# Patient Record
Sex: Male | Born: 1945 | Race: White | Hispanic: No | Smoking: Former smoker
Health system: Southern US, Community
[De-identification: ages and names within clinical notes are randomized; demographics above are authoritative.]

## PROBLEM LIST (undated history)

## (undated) DIAGNOSIS — J449 Chronic obstructive pulmonary disease, unspecified: Secondary | ICD-10-CM

## (undated) DIAGNOSIS — I10 Essential (primary) hypertension: Secondary | ICD-10-CM

---

## 2014-05-23 DIAGNOSIS — I5030 Unspecified diastolic (congestive) heart failure: Secondary | ICD-10-CM | POA: Insufficient documentation

## 2015-06-28 DIAGNOSIS — Z5320 Procedure and treatment not carried out because of patient's decision for unspecified reasons: Secondary | ICD-10-CM | POA: Insufficient documentation

## 2015-07-11 DIAGNOSIS — G4733 Obstructive sleep apnea (adult) (pediatric): Secondary | ICD-10-CM | POA: Insufficient documentation

## 2016-03-28 DIAGNOSIS — J449 Chronic obstructive pulmonary disease, unspecified: Secondary | ICD-10-CM | POA: Insufficient documentation

## 2016-03-28 DIAGNOSIS — I272 Pulmonary hypertension, unspecified: Secondary | ICD-10-CM | POA: Insufficient documentation

## 2017-09-23 DIAGNOSIS — Z86010 Personal history of colonic polyps: Secondary | ICD-10-CM | POA: Insufficient documentation

## 2018-01-22 DIAGNOSIS — R7303 Prediabetes: Secondary | ICD-10-CM | POA: Insufficient documentation

## 2018-07-23 DIAGNOSIS — G609 Hereditary and idiopathic neuropathy, unspecified: Secondary | ICD-10-CM | POA: Insufficient documentation

## 2019-01-01 DIAGNOSIS — Z87891 Personal history of nicotine dependence: Secondary | ICD-10-CM | POA: Insufficient documentation

## 2020-03-15 ENCOUNTER — Inpatient Hospital Stay (HOSPITAL_COMMUNITY): Payer: Medicare Other

## 2020-03-15 ENCOUNTER — Emergency Department (HOSPITAL_COMMUNITY): Payer: Medicare Other

## 2020-03-15 ENCOUNTER — Encounter (HOSPITAL_COMMUNITY): Payer: Self-pay | Admitting: Emergency Medicine

## 2020-03-15 ENCOUNTER — Other Ambulatory Visit: Payer: Self-pay

## 2020-03-15 ENCOUNTER — Inpatient Hospital Stay (HOSPITAL_COMMUNITY)
Admission: EM | Admit: 2020-03-15 | Discharge: 2020-03-21 | DRG: 522 | Disposition: A | Discharge status: Skilled Nursing Facility | Payer: Medicare Other | Attending: Internal Medicine | Admitting: Internal Medicine

## 2020-03-15 DIAGNOSIS — Z7901 Long term (current) use of anticoagulants: Secondary | ICD-10-CM | POA: Diagnosis not present

## 2020-03-15 DIAGNOSIS — Y92019 Unspecified place in single-family (private) house as the place of occurrence of the external cause: Secondary | ICD-10-CM | POA: Diagnosis not present

## 2020-03-15 DIAGNOSIS — Z419 Encounter for procedure for purposes other than remedying health state, unspecified: Secondary | ICD-10-CM

## 2020-03-15 DIAGNOSIS — J449 Chronic obstructive pulmonary disease, unspecified: Secondary | ICD-10-CM | POA: Diagnosis present

## 2020-03-15 DIAGNOSIS — G4733 Obstructive sleep apnea (adult) (pediatric): Secondary | ICD-10-CM | POA: Diagnosis present

## 2020-03-15 DIAGNOSIS — I10 Essential (primary) hypertension: Secondary | ICD-10-CM | POA: Diagnosis present

## 2020-03-15 DIAGNOSIS — S72001A Fracture of unspecified part of neck of right femur, initial encounter for closed fracture: Secondary | ICD-10-CM | POA: Diagnosis present

## 2020-03-15 DIAGNOSIS — Z20822 Contact with and (suspected) exposure to covid-19: Secondary | ICD-10-CM | POA: Diagnosis present

## 2020-03-15 DIAGNOSIS — Y92009 Unspecified place in unspecified non-institutional (private) residence as the place of occurrence of the external cause: Secondary | ICD-10-CM

## 2020-03-15 DIAGNOSIS — Z87891 Personal history of nicotine dependence: Secondary | ICD-10-CM

## 2020-03-15 DIAGNOSIS — R944 Abnormal results of kidney function studies: Secondary | ICD-10-CM | POA: Diagnosis not present

## 2020-03-15 DIAGNOSIS — W1839XA Other fall on same level, initial encounter: Secondary | ICD-10-CM | POA: Diagnosis present

## 2020-03-15 DIAGNOSIS — I959 Hypotension, unspecified: Secondary | ICD-10-CM | POA: Diagnosis not present

## 2020-03-15 DIAGNOSIS — S72011A Unspecified intracapsular fracture of right femur, initial encounter for closed fracture: Principal | ICD-10-CM | POA: Diagnosis present

## 2020-03-15 DIAGNOSIS — I4821 Permanent atrial fibrillation: Secondary | ICD-10-CM | POA: Diagnosis present

## 2020-03-15 DIAGNOSIS — E860 Dehydration: Secondary | ICD-10-CM | POA: Diagnosis not present

## 2020-03-15 DIAGNOSIS — Z79899 Other long term (current) drug therapy: Secondary | ICD-10-CM

## 2020-03-15 DIAGNOSIS — Z96641 Presence of right artificial hip joint: Secondary | ICD-10-CM

## 2020-03-15 DIAGNOSIS — W19XXXA Unspecified fall, initial encounter: Secondary | ICD-10-CM

## 2020-03-15 HISTORY — DX: Essential (primary) hypertension: I10

## 2020-03-15 HISTORY — DX: Chronic obstructive pulmonary disease, unspecified: J44.9

## 2020-03-15 LAB — COMPREHENSIVE METABOLIC PANEL
ALT: 28 U/L (ref 0–44)
AST: 33 U/L (ref 15–41)
Albumin: 4 g/dL (ref 3.5–5.0)
Alkaline Phosphatase: 78 U/L (ref 38–126)
Anion gap: 10 (ref 5–15)
BUN: 22 mg/dL (ref 8–23)
CO2: 22 mmol/L (ref 22–32)
Calcium: 9.1 mg/dL (ref 8.9–10.3)
Chloride: 106 mmol/L (ref 98–111)
Creatinine, Ser: 0.86 mg/dL (ref 0.61–1.24)
GFR calc Af Amer: >60 mL/min (ref >60)
GFR calc non Af Amer: >60 mL/min (ref >60)
Glucose, Bld: 126 mg/dL — ABNORMAL HIGH (ref 70–99)
Potassium: 5 mmol/L (ref 3.5–5.1)
Sodium: 138 mmol/L (ref 135–145)
Total Bilirubin: 1.2 mg/dL (ref 0.3–1.2)
Total Protein: 6.6 g/dL (ref 6.5–8.1)

## 2020-03-15 LAB — CBC WITH DIFFERENTIAL/PLATELET
Abs Immature Granulocytes: 0.05 10*3/uL (ref 0.00–0.07)
Basophils Absolute: 0 10*3/uL (ref 0.0–0.1)
Basophils Relative: 0 %
Eosinophils Absolute: 0.1 10*3/uL (ref 0.0–0.5)
Eosinophils Relative: 1 %
HCT: 49.2 % (ref 39.0–52.0)
Hemoglobin: 16.2 g/dL (ref 13.0–17.0)
Immature Granulocytes: 1 %
Lymphocytes Relative: 8 %
Lymphs Abs: 0.7 10*3/uL (ref 0.7–4.0)
MCH: 29.8 pg (ref 26.0–34.0)
MCHC: 32.9 g/dL (ref 30.0–36.0)
MCV: 90.6 fL (ref 80.0–100.0)
Monocytes Absolute: 0.6 10*3/uL (ref 0.1–1.0)
Monocytes Relative: 7 %
Neutro Abs: 7.7 10*3/uL (ref 1.7–7.7)
Neutrophils Relative %: 83 %
Platelets: 191 10*3/uL (ref 150–400)
RBC: 5.43 MIL/uL (ref 4.22–5.81)
RDW: 15.3 % (ref 11.5–15.5)
WBC: 9.2 10*3/uL (ref 4.0–10.5)
nRBC: 0 % (ref 0.0–0.2)

## 2020-03-15 LAB — SARS CORONAVIRUS 2 BY RT PCR (HOSPITAL ORDER, PERFORMED IN ~~LOC~~ HOSPITAL LAB): SARS Coronavirus 2: NEGATIVE

## 2020-03-15 LAB — MAGNESIUM: Magnesium: 2.2 mg/dL (ref 1.7–2.4)

## 2020-03-15 MED ORDER — POVIDONE-IODINE 10 % EX SWAB: 2.0000 "application " | Freq: Once | CUTANEOUS | Status: AC

## 2020-03-15 MED ORDER — AMLODIPINE BESYLATE 5 MG PO TABS
2.5000 mg | ORAL_TABLET | Freq: Every day | ORAL | Status: DC
  Administered 2020-03-15 – 2020-03-17 (×3): 2.5 mg via ORAL
  Filled 2020-03-15 (×3): qty 1

## 2020-03-15 MED ORDER — CHLORHEXIDINE GLUCONATE 4 % EX LIQD
60.0000 mL | Freq: Once | CUTANEOUS | Status: DC
  Filled 2020-03-15: qty 60

## 2020-03-15 MED ORDER — CEFAZOLIN SODIUM-DEXTROSE 2-4 GM/100ML-% IV SOLN
2.0000 g | INTRAVENOUS | Status: AC
  Administered 2020-03-16: 2 g via INTRAVENOUS
  Filled 2020-03-15: qty 100

## 2020-03-15 MED ORDER — HYDROMORPHONE HCL 1 MG/ML IJ SOLN
0.5000 mg | INTRAMUSCULAR | Status: DC | PRN
  Administered 2020-03-15: 1 mg via INTRAVENOUS
  Filled 2020-03-15: qty 1

## 2020-03-15 MED ORDER — IRBESARTAN 150 MG PO TABS
150.0000 mg | ORAL_TABLET | Freq: Every day | ORAL | Status: DC
  Administered 2020-03-15 – 2020-03-17 (×3): 150 mg via ORAL
  Filled 2020-03-15 (×4): qty 1

## 2020-03-15 MED ORDER — MOMETASONE FURO-FORMOTEROL FUM 200-5 MCG/ACT IN AERO: 2.0000 | INHALATION_SPRAY | Freq: Two times a day (BID) | RESPIRATORY_TRACT | Status: DC

## 2020-03-15 MED ORDER — ONDANSETRON HCL 4 MG/2ML IJ SOLN: 4.0000 mg | Freq: Four times a day (QID) | INTRAMUSCULAR | Status: DC | PRN

## 2020-03-15 MED ORDER — HYDROCODONE-ACETAMINOPHEN 5-325 MG PO TABS
1.0000 | ORAL_TABLET | Freq: Four times a day (QID) | ORAL | Status: DC | PRN
  Administered 2020-03-15 – 2020-03-21 (×3): 1 via ORAL
  Filled 2020-03-15 (×3): qty 1

## 2020-03-15 MED ORDER — TRANEXAMIC ACID-NACL 1000-0.7 MG/100ML-% IV SOLN
1000.0000 mg | INTRAVENOUS | Status: AC
  Administered 2020-03-16: 1000 mg via INTRAVENOUS
  Filled 2020-03-15 (×2): qty 100

## 2020-03-15 MED ORDER — ACETAMINOPHEN 650 MG RE SUPP: 650.0000 mg | Freq: Four times a day (QID) | RECTAL | Status: DC | PRN

## 2020-03-15 MED ORDER — SPIRONOLACTONE 12.5 MG HALF TABLET: 12.5000 mg | ORAL_TABLET | Freq: Every day | ORAL | Status: DC

## 2020-03-15 MED ORDER — ENOXAPARIN SODIUM 40 MG/0.4ML ~~LOC~~ SOLN: 40.0000 mg | SUBCUTANEOUS | Status: DC

## 2020-03-15 MED ORDER — LACTATED RINGERS IV SOLN: INTRAVENOUS | Status: DC

## 2020-03-15 MED ORDER — HYDRALAZINE HCL 20 MG/ML IJ SOLN: 10.0000 mg | Freq: Four times a day (QID) | INTRAMUSCULAR | Status: DC | PRN

## 2020-03-15 MED ORDER — ACETAMINOPHEN 325 MG PO TABS
650.0000 mg | ORAL_TABLET | Freq: Four times a day (QID) | ORAL | Status: DC | PRN
  Administered 2020-03-18 – 2020-03-19 (×4): 650 mg via ORAL
  Filled 2020-03-15 (×4): qty 2

## 2020-03-15 MED ORDER — ROSUVASTATIN CALCIUM 10 MG PO TABS
10.0000 mg | ORAL_TABLET | Freq: Every day | ORAL | Status: DC
  Administered 2020-03-15 – 2020-03-20 (×6): 10 mg via ORAL
  Filled 2020-03-15 (×6): qty 1

## 2020-03-15 MED ORDER — FUROSEMIDE 40 MG PO TABS
40.0000 mg | ORAL_TABLET | Freq: Every day | ORAL | Status: DC
  Administered 2020-03-15 – 2020-03-21 (×7): 40 mg via ORAL
  Filled 2020-03-15 (×7): qty 1

## 2020-03-15 MED ORDER — ESCITALOPRAM OXALATE 10 MG PO TABS
10.0000 mg | ORAL_TABLET | Freq: Every day | ORAL | Status: DC
  Administered 2020-03-15 – 2020-03-21 (×7): 10 mg via ORAL
  Filled 2020-03-15 (×7): qty 1

## 2020-03-15 MED ORDER — AMLODIPINE BESYLATE-VALSARTAN 5-320 MG PO TABS: 0.5000 | ORAL_TABLET | Freq: Every day | ORAL | Status: DC

## 2020-03-15 MED ORDER — HYDROMORPHONE HCL 1 MG/ML IJ SOLN
0.5000 mg | Freq: Once | INTRAMUSCULAR | Status: AC
  Administered 2020-03-15: 0.5 mg via INTRAVENOUS
  Filled 2020-03-15: qty 1

## 2020-03-15 MED ORDER — ATENOLOL 25 MG PO TABS
25.0000 mg | ORAL_TABLET | Freq: Every day | ORAL | Status: DC
  Administered 2020-03-15 – 2020-03-17 (×3): 25 mg via ORAL
  Filled 2020-03-15 (×4): qty 1

## 2020-03-15 MED ORDER — ONDANSETRON HCL 4 MG PO TABS: 4.0000 mg | ORAL_TABLET | Freq: Four times a day (QID) | ORAL | Status: DC | PRN

## 2020-03-15 MED ORDER — MOMETASONE FURO-FORMOTEROL FUM 200-5 MCG/ACT IN AERO
2.0000 | INHALATION_SPRAY | Freq: Two times a day (BID) | RESPIRATORY_TRACT | Status: DC
  Administered 2020-03-15 – 2020-03-21 (×12): 2 via RESPIRATORY_TRACT
  Filled 2020-03-15: qty 8.8

## 2020-03-15 NOTE — ED Triage Notes (Addendum)
Patient BIBA from home, reports falling on R hip after standing up from chair. E MS states hip appears to be dislocated and shortened. No LOC. Patient AOx4. Hx sleep apnea and COPD. Patient on blood thinners   BP130/59 P 88 RR 17 T 97.9   50 mcg Fentanyl  20 L AC

## 2020-03-15 NOTE — Anesthesia Preprocedure Evaluation (Addendum)
Anesthesia Evaluation  Patient identified by MRN, date of birth, ID band Patient awake    Reviewed: Allergy & Precautions, NPO status , Patient's Chart, lab work & pertinent test results  Airway Mallampati: II  TM Distance: >3 FB Neck ROM: Full    Dental no notable dental hx. (+) Teeth Intact, Dental Advisory Given   Pulmonary COPD,  COPD inhaler, former smoker,    Pulmonary exam normal breath sounds clear to auscultation       Cardiovascular hypertension, Pt. on medications and Pt. on home beta blockers Normal cardiovascular exam+ dysrhythmias Atrial Fibrillation  Rhythm:Regular Rate:Normal     Neuro/Psych negative neurological ROS  negative psych ROS   GI/Hepatic negative GI ROS, Neg liver ROS,   Endo/Other  negative endocrine ROS  Renal/GU negative Renal ROSK+ 5.0 Cr 0.86     Musculoskeletal negative musculoskeletal ROS (+)   Abdominal   Peds  Hematology negative hematology ROS (+) Hgb 16.2   Anesthesia Other Findings   Reproductive/Obstetrics                            Anesthesia Physical Anesthesia Plan  ASA: III  Anesthesia Plan: General   Post-op Pain Management:    Induction: Intravenous  PONV Risk Score and Plan: 3 and Treatment may vary due to age or medical condition, Ondansetron and Dexamethasone  Airway Management Planned: Oral ETT  Additional Equipment: None  Intra-op Plan:   Post-operative Plan: Extubation in OR  Informed Consent: I have reviewed the patients History and Physical, chart, labs and discussed the procedure including the risks, benefits and alternatives for the proposed anesthesia with the patient or authorized representative who has indicated his/her understanding and acceptance.     Dental advisory given  Plan Discussed with: CRNA and Anesthesiologist  Anesthesia Plan Comments: (Pt on Eliquis   GA)       Anesthesia Quick  Evaluation

## 2020-03-15 NOTE — Consult Note (Signed)
ORTHOPAEDIC CONSULTATION  REQUESTING PHYSICIAN: Darliss Cheney, MD  PCP:  Endocrinology, [facility]  Chief Complaint: Right hip pain  HPI: Ronald Shepherd is a 74 y.o. male who presented to [place] ED on 03/15/20 after sustaining a fall this morning. He reports he got out of his recliner after waking up this morning, and almost immediately tripped and fell onto his right hip. He was unable to stand after several attempts, but was close enough to press his life alert button. EMS arrived and transported him to the ED. He denies head injury or LOC. In the ED, imaging revealed right hip comminuted subcapital femoral neck fracture. Dr. Wynelle Link was consulted for orthopaedic management.  He is on Eliquis for atrial fibrillation, and his last dose was 03/14/20 at 9 pm. He reports that he lives alone, but does have family near by. At baseline, he ambulates without assistive devices.   Past Medical History:  Diagnosis Date  . COPD (chronic obstructive pulmonary disease) ([place])   . Hypertension    History reviewed. No pertinent surgical history. Social History   Socioeconomic History  . Marital status: Widowed    Spouse name: Not on file  . Number of children: Not on file  . Years of education: Not on file  . Highest education level: Not on file  Occupational History  . Not on file  Tobacco Use  . Smoking status: Former Smoker    Packs/day: 1.00    Years: 20.00    Pack years: 20.00    Types: Cigarettes    Quit date: 02/14/2015    Years since quitting: 5.0  . Smokeless tobacco: Never Used  Substance and Sexual Activity  . Alcohol use: Not Currently  . Drug use: Never  . Sexual activity: Not on file  Other Topics Concern  . Not on file  Social History Narrative  . Not on file   Social Determinants of Health   Financial Resource Strain:   . Difficulty of Paying Living Expenses:   Food Insecurity:   . Worried About Charity fundraiser in the Last Year:   . Arboriculturist in  the Last Year:   Transportation Needs:   . Film/video editor (Medical):   Marland Kitchen Lack of Transportation (Non-Medical):   Physical Activity:   . Days of Exercise per Week:   . Minutes of Exercise per Session:   Stress:   . Feeling of Stress :   Social Connections:   . Frequency of Communication with Friends and Family:   . Frequency of Social Gatherings with Friends and Family:   . Attends Religious Services:   . Active Member of Clubs or Organizations:   . Attends Archivist Meetings:   Marland Kitchen Marital Status:    History reviewed. No pertinent family history. No Known Allergies Prior to Admission medications   Medication Sig Start Date End Date Taking? Authorizing Provider  albuterol (VENTOLIN HFA) 108 (90 Base) MCG/ACT inhaler Inhale 2 puffs into the lungs every 4 (four) hours as needed for shortness of breath. 05/02/17  Yes [provider]  amLODipine-valsartan (EXFORGE) 5-320 MG tablet Take 0.5 tablets by mouth daily. 01/12/20  Yes [provider]  Ascorbic Acid (VITAMIN C PO) Take 1 tablet by mouth daily.   Yes [provider]  atenolol (TENORMIN) 25 MG tablet Take 25 mg by mouth daily. 01/12/20  Yes [provider]  b complex vitamins capsule Take 1 capsule by mouth daily.   Yes [provider]  Cholecalciferol 25 MCG (1000 UT) capsule Take 1,000 Units by mouth daily. 05/02/15  Yes [provider]  ELIQUIS 5 MG TABS tablet Take 5 mg by mouth 2 (two) times daily. 01/12/20  Yes [provider]  escitalopram (LEXAPRO) 10 MG tablet Take 10 mg by mouth daily. 02/28/20  Yes [provider]  Fluticasone-Salmeterol (ADVAIR) 250-50 MCG/DOSE AEPB Inhale 1 puff into the lungs 2 (two) times daily. 02/29/20  Yes [provider]  furosemide (LASIX) 40 MG tablet Take 40 mg by mouth daily. 02/25/20  Yes [provider]  Multiple Vitamins-Minerals (CENTRUM PO) Take 1 tablet by mouth daily.   Yes [provider]  potassium chloride SA (KLOR-CON) 20 MEQ tablet Take 20 mEq by mouth 2 (two) times daily. 03/02/20  Yes [provider]  rosuvastatin (CRESTOR) 10 MG tablet Take 10 mg by mouth at bedtime. 01/25/20  Yes [provider]  spironolactone (ALDACTONE) 25 MG tablet Take 12.5 mg by mouth daily. 02/25/20  Yes [provider]   DG Chest Port 1 View  Result Date: 03/15/2020 CLINICAL DATA:  Recent fall with hip fracture, initial encounter EXAM: PORTABLE CHEST 1 VIEW COMPARISON:  04/28/2015 FINDINGS: Cardiac shadow is enlarged but stable. Aortic calcifications are again seen. The lungs are hyperinflated consistent with COPD but stable. No infiltrate is seen. No bony abnormality is noted. IMPRESSION: No acute abnormality noted. Electronically Signed   By: Alcide Clever M.D.   On: 03/15/2020 10:33   DG Hip Unilat W or Wo Pelvis 2-3 Views Right  Result Date: 03/15/2020 CLINICAL DATA:  Pain following fall EXAM: DG HIP (WITH OR WITHOUT PELVIS) 2-3V RIGHT COMPARISON:  None. FINDINGS: Frontal pelvis as well as frontal and lateral right hip images were obtained. There is a comminuted fracture of the subcapital femoral neck region with varus angulation at the fracture site. There is no other fracture appreciable. No dislocation. There is moderate symmetric narrowing of each hip joint. No erosive change. IMPRESSION: Comminuted subcapital femoral neck fracture on the right with varus angulation at the fracture site. No other fracture. No dislocation. Symmetric narrowing each hip joint. Electronically Signed   By: Bretta Bang III M.D.   On: 03/15/2020 09:52    Positive ROS: All other systems have been reviewed and were otherwise negative with the exception of those mentioned in the HPI and as above.  Physical Exam: General: Alert, no acute distress Cardiovascular: No pedal edema Respiratory: No cyanosis, no use of accessory musculature GI: No organomegaly, abdomen is soft and  non-tender Skin: No lesions in the area of chief complaint Neurologic: Sensation intact distally Psychiatric: Patient is competent for consent with normal mood and affect Lymphatic: No axillary or cervical lymphadenopathy  MUSCULOSKELETAL:   Right Lower Extremity: Skin intact about the right hip. No evidence of abrasions or ecchymosis. Distal pulses 2+, sensation intact.   Assessment: Right femoral neck fracture   Plan: Radiographic findings discussed with Ronald Shepherd today. We will plan for surgical management with right total hip arthroplasty. He is on Eliquis, with last dose 03/15/20 at 9 pm. We discussed timing of surgery today as it relates to risk of bleeding, and we agree to hold off on surgery until tomorrow. Dr. Lequita Halt, Dr. Charlann Boxer, or Dr. Linna Caprice will perform the surgery tomorrow depending on availability. He will be NPO after midnight tonight. Continue pain management.   Tommie Ard, PA-C Cell [phone]   03/15/2020 1:12 PM

## 2020-03-15 NOTE — ED Provider Notes (Signed)
[facility]-EMERGENCY DEPT Provider Note   CSN: [id number] Arrival date & time: 03/15/20  0855     History Chief Complaint  Patient presents with  . Fall    Ronald Shepherd is a 74 y.o. male.  Patient states that he slipped and fell today landing on his right hip.  Patient did not hit his head and does not complain of any other areas of discomfort.  Patient has history of COPD hypertension atrial fib.  He is on Eliquis but the last time he took it was 9 PM yesterday  The history is provided by the patient. No language interpreter was used.  Fall This is a new problem. The current episode started 3 to 5 hours ago. The problem occurs rarely. The problem has been resolved. Pertinent negatives include no chest pain, no abdominal pain and no headaches. Nothing aggravates the symptoms. Nothing relieves the symptoms. He has tried nothing for the symptoms. The treatment provided no relief.       Past Medical History:  Diagnosis Date  . COPD (chronic obstructive pulmonary disease) ([facility])   . Hypertension     Patient Active Problem List   Diagnosis Date Noted  . Closed right hip fracture ([facility]) 03/15/2020  . Essential hypertension 03/15/2020    History reviewed. No pertinent surgical history.     History reviewed. No pertinent family history.  Social History   Tobacco Use  . Smoking status: Former Smoker    Packs/day: 1.00    Years: 20.00    Pack years: 20.00    Types: Cigarettes    Quit date: 02/14/2015    Years since quitting: 5.0  . Smokeless tobacco: Never Used  Substance Use Topics  . Alcohol use: Not Currently  . Drug use: Never    Home Medications Prior to Admission medications   Medication Sig Start Date End Date Taking? Authorizing Provider  amLODipine-valsartan (EXFORGE) 5-320 MG tablet Take 0.5 tablets by mouth daily. 01/12/20   [provider]  atenolol (TENORMIN) 25 MG tablet Take 25 mg by mouth daily. 01/12/20   [provider]  ELIQUIS 5 MG TABS tablet Take 5 mg by mouth 2 (two) times daily. 01/12/20   [provider]  escitalopram (LEXAPRO) 10 MG tablet Take 10 mg by mouth daily. 02/28/20   [provider]  Fluticasone-Salmeterol (ADVAIR) 250-50 MCG/DOSE AEPB Inhale 1 puff into the lungs 2 (two) times daily. 02/29/20   [provider]  furosemide (LASIX) 40 MG tablet Take 40 mg by mouth daily. 02/25/20   [provider]  potassium chloride SA (KLOR-CON) 20 MEQ tablet Take 20 mEq by mouth 2 (two) times daily. 03/02/20   [provider]  rosuvastatin (CRESTOR) 10 MG tablet Take 10 mg by mouth at bedtime. 01/25/20   [provider]  spironolactone (ALDACTONE) 25 MG tablet Take 12.5 mg by mouth daily. 02/25/20   [provider]    Allergies    Patient has no known allergies.  Review of Systems   Review of Systems  Constitutional: Negative for appetite change and fatigue.  HENT: Negative for congestion, ear discharge and sinus pressure.   Eyes: Negative for discharge.  Respiratory: Negative for cough.   Cardiovascular: Negative for chest pain.  Gastrointestinal: Negative for abdominal pain and diarrhea.  Genitourinary: Negative for frequency and hematuria.  Musculoskeletal: Negative for back pain.       Right hip pain  Skin: Negative for rash.  Neurological: Negative for seizures and headaches.  Psychiatric/Behavioral: Negative for hallucinations.    Physical Exam Updated Vital Signs BP 137/90 (BP Location: Left Arm)   Pulse 69   Temp 97.9 F (36.6 C) (Oral)   Resp 16   Ht 5\' 10"  (1.778 m)   Wt 105.2 kg   SpO2 97%   BMI 33.29 kg/m   Physical Exam Vitals and nursing note reviewed.  Constitutional:      Appearance: He is well-developed.  HENT:     Head: Normocephalic.     Nose: Nose normal.  Eyes:     General: No scleral icterus.    Conjunctiva/sclera: Conjunctivae normal.  Neck:     Thyroid: No thyromegaly.  Cardiovascular:      Rate and Rhythm: Normal rate and regular rhythm.     Heart sounds: No murmur. No friction rub. No gallop.   Pulmonary:     Breath sounds: No stridor. No wheezing or rales.  Chest:     Chest wall: No tenderness.  Abdominal:     General: There is no distension.     Tenderness: There is no abdominal tenderness. There is no rebound.  Musculoskeletal:     Cervical back: Neck supple.     Comments: Tender right hip  Lymphadenopathy:     Cervical: No cervical adenopathy.  Skin:    Findings: No erythema or rash.  Neurological:     Mental Status: He is alert and oriented to person, place, and time.     Motor: No abnormal muscle tone.     Coordination: Coordination normal.  Psychiatric:        Behavior: Behavior normal.     ED Results / Procedures / Treatments   Labs (all labs ordered are listed, but only abnormal results are displayed) Labs Reviewed  COMPREHENSIVE METABOLIC PANEL - Abnormal; Notable for the following components:      Result Value   Glucose, Bld 126 (*)    All other components within normal limits  SARS CORONAVIRUS 2 BY RT PCR (HOSPITAL ORDER, PERFORMED IN [facility])  CBC WITH DIFFERENTIAL/PLATELET  CBC  CREATININE, SERUM  MAGNESIUM    EKG None  Radiology DG Hip Unilat W or Wo Pelvis 2-3 Views Right  Result Date: 03/15/2020 CLINICAL DATA:  Pain following fall EXAM: DG HIP (WITH OR WITHOUT PELVIS) 2-3V RIGHT COMPARISON:  None. FINDINGS: Frontal pelvis as well as frontal and lateral right hip images were obtained. There is a comminuted fracture of the subcapital femoral neck region with varus angulation at the fracture site. There is no other fracture appreciable. No dislocation. There is moderate symmetric narrowing of each hip joint. No erosive change. IMPRESSION: Comminuted subcapital femoral neck fracture on the right with varus angulation at the fracture site. No other fracture. No dislocation. Symmetric narrowing each hip joint.  Electronically Signed   By: 03/17/2020 III M.D.   On: 03/15/2020 09:52    Procedures Procedures (including critical care time)  Medications Ordered in ED Medications  HYDROmorphone (DILAUDID) injection 0.5 mg (has no administration in time range)  amLODipine-valsartan (EXFORGE) 5-320 MG per tablet 0.5 tablet (has no administration in time range)  atenolol (TENORMIN) tablet 25 mg (has no administration in time range)  furosemide (LASIX) tablet 40 mg (has no administration in time range)  rosuvastatin (CRESTOR) tablet 10 mg (has no administration in time range)  spironolactone (ALDACTONE) tablet 12.5 mg (has no administration in time range)  escitalopram (LEXAPRO) tablet 10 mg (has no administration in time range)  mometasone-formoterol (DULERA)  200-5 MCG/ACT inhaler 2 puff (has no administration in time range)  enoxaparin (LOVENOX) injection 40 mg (has no administration in time range)  acetaminophen (TYLENOL) tablet 650 mg (has no administration in time range)    Or  acetaminophen (TYLENOL) suppository 650 mg (has no administration in time range)  HYDROmorphone (DILAUDID) injection 0.5-1 mg (has no administration in time range)  ondansetron (ZOFRAN) tablet 4 mg (has no administration in time range)    Or  ondansetron (ZOFRAN) injection 4 mg (has no administration in time range)  hydrALAZINE (APRESOLINE) injection 10 mg (has no administration in time range)    ED Course  I have reviewed the triage vital signs and the nursing notes.  Pertinent labs & imaging results that were available during my care of the patient were reviewed by me and considered in my medical decision making (see chart for details).    MDM Rules/Calculators/A&P                      Patient with right hip fracture.  Patient requested emerge Ortho and I contacted Dr. Elmyra Ricks.  He was in surgery at the time and requested medicine to admit the patient and Ortho will see the patient later today.     This  patient presents to the ED for concern of right hip pain this involves an extensive number of treatment options, and is a complaint that carries with it a high risk of complications and morbidity.  The differential diagnosis includes fractured hip dislocated hip contusion hip   Lab Tests:   I Ordered, reviewed, and interpreted labs, which included CBC chemistries unremarkable  Medicines ordered:   I ordered medication Dilaudid for pain  Imaging Studies ordered:   I ordered imaging studies which included right hip and  I independently visualized and interpreted imaging which showed fractured hip  Additional history obtained:   Additional history obtained from daughter  Previous records obtained and reviewed   Consultations Obtained:   I consulted orthopedics and hospitalist and discussed lab and imaging findings  Reevaluation:  After the interventions stated above, I reevaluated the patient and found unchanged  Critical Interventions:  .   Final Clinical Impression(s) / ED Diagnoses Final diagnoses:  Closed fracture of right hip, initial encounter [facility])    Rx / [place] Orders ED Discharge Orders    None       Milton Ferguson, MD 03/15/20 1038

## 2020-03-15 NOTE — H&P (Signed)
History and Physical    Ronald Shepherd CNO:[id number] DOB: [birth year] DOA: 03/15/2020  PCP: Endocrinology, [facility]  Patient coming from: Home  I have personally briefly reviewed patient's old medical records in [facility]  Chief Complaint: Fall and right hip pain  HPI: Ronald Shepherd is a 74 y.o. male with medical history significant of hypertension and permanent atrial fibrillation on anticoagulation presented to ED with a complaint of fall.  According to patient, while getting out of his recliner at 7:30 AM, he had a mechanical fall and he landed on the floor on his right hip and started hurting.  EMS was called.  He was brought into the emergency department.  No other complaints such as chest pain, shortness of breath, fever, chills or anything else.  Patient's last dose of Eliquis was last night.  ED Course: Upon arrival to ED, he was hemodynamically stable.  Further work-up revealed right femoral neck fracture.  Orthopedic surgery was consulted.  Hospitalist were consulted to meet the patient for further management.  Review of Systems: As per HPI otherwise negative.    Past Medical History:  Diagnosis Date  . COPD (chronic obstructive pulmonary disease) (HCC)   . Hypertension     History reviewed. No pertinent surgical history.   reports that he quit smoking about 5 years ago. His smoking use included cigarettes. He has a 20.00 pack-year smoking history. He has never used smokeless tobacco. He reports previous alcohol use. He reports that he does not use drugs.  No Known Allergies  History reviewed. No pertinent family history.  Prior to Admission medications   Medication Sig Start Date End Date Taking? Authorizing Provider  albuterol (VENTOLIN HFA) 108 (90 Base) MCG/ACT inhaler Inhale 2 puffs into the lungs every 4 (four) hours as needed for shortness of breath. 05/02/17  Yes [provider]  amLODipine-valsartan (EXFORGE) 5-320 MG tablet Take 0.5 tablets by mouth  daily. 01/12/20  Yes [provider]  Ascorbic Acid (VITAMIN C PO) Take 1 tablet by mouth daily.   Yes [provider]  atenolol (TENORMIN) 25 MG tablet Take 25 mg by mouth daily. 01/12/20  Yes [provider]  b complex vitamins capsule Take 1 capsule by mouth daily.   Yes [provider]  Cholecalciferol 25 MCG (1000 UT) capsule Take 1,000 Units by mouth daily. 05/02/15  Yes [provider]  ELIQUIS 5 MG TABS tablet Take 5 mg by mouth 2 (two) times daily. 01/12/20  Yes [provider]  escitalopram (LEXAPRO) 10 MG tablet Take 10 mg by mouth daily. 02/28/20  Yes [provider]  Fluticasone-Salmeterol (ADVAIR) 250-50 MCG/DOSE AEPB Inhale 1 puff into the lungs 2 (two) times daily. 02/29/20  Yes [provider]  furosemide (LASIX) 40 MG tablet Take 40 mg by mouth daily. 02/25/20  Yes [provider]  Multiple Vitamins-Minerals (CENTRUM PO) Take 1 tablet by mouth daily.   Yes [provider]  potassium chloride SA (KLOR-CON) 20 MEQ tablet Take 20 mEq by mouth 2 (two) times daily. 03/02/20  Yes [provider]  rosuvastatin (CRESTOR) 10 MG tablet Take 10 mg by mouth at bedtime. 01/25/20  Yes [provider]  spironolactone (ALDACTONE) 25 MG tablet Take 12.5 mg by mouth daily. 02/25/20  Yes [provider]    Physical Exam: Vitals:   03/15/20 0903 03/15/20 0908  BP: 137/90   Pulse: 69   Resp: 16   Temp: 97.9 F (36.6 C)   TempSrc: Oral   SpO2:  98% 97%  Weight:  105.2 kg  Height:  5\' 10"  (1.778 m)    Constitutional: NAD, calm, comfortable Vitals:   03/15/20 0903 03/15/20 0908  BP: 137/90   Pulse: 69   Resp: 16   Temp: 97.9 F (36.6 C)   TempSrc: Oral   SpO2: 98% 97%  Weight:  105.2 kg  Height:  5\' 10"  (1.778 m)   Eyes: PERRL, lids and conjunctivae normal ENMT: Mucous membranes are moist. Posterior pharynx clear of any exudate or lesions.Normal dentition.  Neck: normal, supple,  no masses, no thyromegaly Respiratory: clear to auscultation bilaterally, no wheezing, no crackles. Normal respiratory effort. No accessory muscle use.  Cardiovascular: Regular rate and rhythm, no murmurs / rubs / gallops. No extremity edema. 2+ pedal pulses. No carotid bruits.  Abdomen: no tenderness, no masses palpated. No hepatosplenomegaly. Bowel sounds positive.  Musculoskeletal: no clubbing / cyanosis.  Right lower extremity shortened and externally rotated.  Point tenderness at the right hip joint. Skin: no rashes, lesions, ulcers. No induration Neurologic: CN 2-12 grossly intact. Sensation intact, DTR normal. Strength 5/5 in all 4.  Psychiatric: Normal judgment and insight. Alert and oriented x 3. Normal mood.    Labs on Admission: I have personally reviewed following labs and imaging studies  CBC: Recent Labs  Lab 03/15/20 0926  WBC 9.2  NEUTROABS 7.7  HGB 16.2  HCT 49.2  MCV 90.6  PLT 191   Basic Metabolic Panel: Recent Labs  Lab 03/15/20 0926  NA 138  K 5.0  CL 106  CO2 22  GLUCOSE 126*  BUN 22  CREATININE 0.86  CALCIUM 9.1   GFR: Estimated Creatinine Clearance: 90.2 mL/min (by C-G formula based on SCr of 0.86 mg/dL). Liver Function Tests: Recent Labs  Lab 03/15/20 0926  AST 33  ALT 28  ALKPHOS 78  BILITOT 1.2  PROT 6.6  ALBUMIN 4.0   No results for input(s): LIPASE, AMYLASE in the last 168 hours. No results for input(s): AMMONIA in the last 168 hours. Coagulation Profile: No results for input(s): INR, PROTIME in the last 168 hours. Cardiac Enzymes: No results for input(s): CKTOTAL, CKMB, CKMBINDEX, TROPONINI in the last 168 hours. BNP (last 3 results) No results for input(s): PROBNP in the last 8760 hours. HbA1C: No results for input(s): HGBA1C in the last 72 hours. CBG: No results for input(s): GLUCAP in the last 168 hours. Lipid Profile: No results for input(s): CHOL, HDL, LDLCALC, TRIG, CHOLHDL, LDLDIRECT in the last 72 hours. Thyroid  Function Tests: No results for input(s): TSH, T4TOTAL, FREET4, T3FREE, THYROIDAB in the last 72 hours. Anemia Panel: No results for input(s): VITAMINB12, FOLATE, FERRITIN, TIBC, IRON, RETICCTPCT in the last 72 hours. Urine analysis: No results found for: COLORURINE, APPEARANCEUR, LABSPEC, PHURINE, GLUCOSEU, HGBUR, BILIRUBINUR, KETONESUR, PROTEINUR, UROBILINOGEN, NITRITE, LEUKOCYTESUR  Radiological Exams on Admission: DG Chest Port 1 View  Result Date: 03/15/2020 CLINICAL DATA:  Recent fall with hip fracture, initial encounter EXAM: PORTABLE CHEST 1 VIEW COMPARISON:  04/28/2015 FINDINGS: Cardiac shadow is enlarged but stable. Aortic calcifications are again seen. The lungs are hyperinflated consistent with COPD but stable. No infiltrate is seen. No bony abnormality is noted. IMPRESSION: No acute abnormality noted. Electronically Signed   By: 03/17/2020 M.D.   On: 03/15/2020 10:33   DG Hip Unilat W or Wo Pelvis 2-3 Views Right  Result Date: 03/15/2020 CLINICAL DATA:  Pain following fall EXAM: DG HIP (WITH OR WITHOUT PELVIS) 2-3V RIGHT COMPARISON:  None. FINDINGS: Frontal pelvis as well  as frontal and lateral right hip images were obtained. There is a comminuted fracture of the subcapital femoral neck region with varus angulation at the fracture site. There is no other fracture appreciable. No dislocation. There is moderate symmetric narrowing of each hip joint. No erosive change. IMPRESSION: Comminuted subcapital femoral neck fracture on the right with varus angulation at the fracture site. No other fracture. No dislocation. Symmetric narrowing each hip joint. Electronically Signed   By: Lowella Grip III M.D.   On: 03/15/2020 09:52    EKG: Pending  Assessment/Plan Active Problems:   Closed right hip fracture [facility])   Essential hypertension   Permanent atrial fibrillation (HCC)   Fall at home, initial encounter    Mechanical fall resulting in closed right femoral neck fracture: Admit to  telemetry.  Pain medication as needed.  N.p.o. from midnight.  Orthopedics on board.  Further management per them.  Permanent atrial fibrillation: Rates controlled.  Resume home medications except Eliquis.  Monitor on telemetry.  Essential hypertension: Blood pressure controlled.  Resume home medications.  COPD: Not in exacerbation.  Resume home Advair.  OSA: Continue CPAP from home.  DVT prophylaxis: Lovenox Code Status: Full code Family Communication: Daughter present at bedside.  Plan of care discussed with patient in length and he verbalized understanding and agreed with it. Disposition Plan: Discharge to home or SNF in 3 to 4 days Consults called: Orthopedics Admission status: Inpatient   Status is: Inpatient  Remains inpatient appropriate because:Inpatient level of care appropriate due to severity of illness   Dispo: The patient is from: Home              Anticipated d/c is to: Home              Anticipated d/c date is: 3 days              Patient currently is not medically stable to d/c.        Darliss Cheney MD [facility]  03/15/2020, 10:51 AM  To contact the attending provider between 7A-7P or the covering provider during after hours 7P-7A, please log into the web site [email or web address]

## 2020-03-15 NOTE — H&P (View-Only) (Signed)
  ORTHOPAEDIC CONSULTATION  REQUESTING PHYSICIAN: Pahwani, Ravi, MD  PCP:  Endocrinology, [facility]  Chief Complaint: Right hip pain  HPI: Ronald Shepherd is a 74 y.o. male who presented to [facility] ED on 03/15/20 after sustaining a fall this morning. He reports he got out of his recliner after waking up this morning, and almost immediately tripped and fell onto his right hip. He was unable to stand after several attempts, but was close enough to press his life alert button. EMS arrived and transported him to the ED. He denies head injury or LOC. In the ED, imaging revealed right hip comminuted subcapital femoral neck fracture. Dr. Aluisio was consulted for orthopaedic management.  He is on Eliquis for atrial fibrillation, and his last dose was 03/14/20 at 9 pm. He reports that he lives alone, but does have family near by. At baseline, he ambulates without assistive devices.   Past Medical History:  Diagnosis Date  . COPD (chronic obstructive pulmonary disease) (HCC)   . Hypertension    History reviewed. No pertinent surgical history. Social History   Socioeconomic History  . Marital status: Widowed    Spouse name: Not on file  . Number of children: Not on file  . Years of education: Not on file  . Highest education level: Not on file  Occupational History  . Not on file  Tobacco Use  . Smoking status: Former Smoker    Packs/day: 1.00    Years: 20.00    Pack years: 20.00    Types: Cigarettes    Quit date: 02/14/2015    Years since quitting: 5.0  . Smokeless tobacco: Never Used  Substance and Sexual Activity  . Alcohol use: Not Currently  . Drug use: Never  . Sexual activity: Not on file  Other Topics Concern  . Not on file  Social History Narrative  . Not on file   Social Determinants of Health   Financial Resource Strain:   . Difficulty of Paying Living Expenses:   Food Insecurity:   . Worried About Running Out of Food in the Last Year:   . Ran Out of Food in  the Last Year:   Transportation Needs:   . Lack of Transportation (Medical):   . Lack of Transportation (Non-Medical):   Physical Activity:   . Days of Exercise per Week:   . Minutes of Exercise per Session:   Stress:   . Feeling of Stress :   Social Connections:   . Frequency of Communication with Friends and Family:   . Frequency of Social Gatherings with Friends and Family:   . Attends Religious Services:   . Active Member of Clubs or Organizations:   . Attends Club or Organization Meetings:   . Marital Status:    History reviewed. No pertinent family history. No Known Allergies Prior to Admission medications   Medication Sig Start Date End Date Taking? Authorizing Provider  albuterol (VENTOLIN HFA) 108 (90 Base) MCG/ACT inhaler Inhale 2 puffs into the lungs every 4 (four) hours as needed for shortness of breath. 05/02/17  Yes [provider]  amLODipine-valsartan (EXFORGE) 5-320 MG tablet Take 0.5 tablets by mouth daily. 01/12/20  Yes [provider]  Ascorbic Acid (VITAMIN C PO) Take 1 tablet by mouth daily.   Yes [provider]  atenolol (TENORMIN) 25 MG tablet Take 25 mg by mouth daily. 01/12/20  Yes [provider]  b complex vitamins capsule Take 1 capsule by mouth daily.   Yes [provider]  Cholecalciferol 25 MCG (1000 UT) capsule Take 1,000 Units by mouth daily. 05/02/15  Yes [provider]  ELIQUIS 5 MG TABS tablet Take 5 mg by mouth 2 (two) times daily. 01/12/20  Yes [provider]  escitalopram (LEXAPRO) 10 MG tablet Take 10 mg by mouth daily. 02/28/20  Yes [provider]  Fluticasone-Salmeterol (ADVAIR) 250-50 MCG/DOSE AEPB Inhale 1 puff into the lungs 2 (two) times daily. 02/29/20  Yes [provider]  furosemide (LASIX) 40 MG tablet Take 40 mg by mouth daily. 02/25/20  Yes [provider]  Multiple Vitamins-Minerals (CENTRUM PO) Take 1 tablet by mouth daily.   Yes [provider]  potassium chloride SA (KLOR-CON) 20 MEQ tablet Take 20 mEq by mouth 2 (two) times daily. 03/02/20  Yes [provider]  rosuvastatin (CRESTOR) 10 MG tablet Take 10 mg by mouth at bedtime. 01/25/20  Yes [provider]  spironolactone (ALDACTONE) 25 MG tablet Take 12.5 mg by mouth daily. 02/25/20  Yes [provider]   DG Chest Port 1 View  Result Date: 03/15/2020 CLINICAL DATA:  Recent fall with hip fracture, initial encounter EXAM: PORTABLE CHEST 1 VIEW COMPARISON:  04/28/2015 FINDINGS: Cardiac shadow is enlarged but stable. Aortic calcifications are again seen. The lungs are hyperinflated consistent with COPD but stable. No infiltrate is seen. No bony abnormality is noted. IMPRESSION: No acute abnormality noted. Electronically Signed   By: Alcide Clever M.D.   On: 03/15/2020 10:33   DG Hip Unilat W or Wo Pelvis 2-3 Views Right  Result Date: 03/15/2020 CLINICAL DATA:  Pain following fall EXAM: DG HIP (WITH OR WITHOUT PELVIS) 2-3V RIGHT COMPARISON:  None. FINDINGS: Frontal pelvis as well as frontal and lateral right hip images were obtained. There is a comminuted fracture of the subcapital femoral neck region with varus angulation at the fracture site. There is no other fracture appreciable. No dislocation. There is moderate symmetric narrowing of each hip joint. No erosive change. IMPRESSION: Comminuted subcapital femoral neck fracture on the right with varus angulation at the fracture site. No other fracture. No dislocation. Symmetric narrowing each hip joint. Electronically Signed   By: Bretta Bang III M.D.   On: 03/15/2020 09:52    Positive ROS: All other systems have been reviewed and were otherwise negative with the exception of those mentioned in the HPI and as above.  Physical Exam: General: Alert, no acute distress Cardiovascular: No pedal edema Respiratory: No cyanosis, no use of accessory musculature GI: No organomegaly, abdomen is soft and  non-tender Skin: No lesions in the area of chief complaint Neurologic: Sensation intact distally Psychiatric: Patient is competent for consent with normal mood and affect Lymphatic: No axillary or cervical lymphadenopathy  MUSCULOSKELETAL:   Right Lower Extremity: Skin intact about the right hip. No evidence of abrasions or ecchymosis. Distal pulses 2+, sensation intact.   Assessment: Right femoral neck fracture   Plan: Radiographic findings discussed with Mr. Egerton today. We will plan for surgical management with right total hip arthroplasty. He is on Eliquis, with last dose 03/15/20 at 9 pm. We discussed timing of surgery today as it relates to risk of bleeding, and we agree to hold off on surgery until tomorrow. Dr. Lequita Halt, Dr. Charlann Boxer, or Dr. Linna Caprice will perform the surgery tomorrow depending on availability. He will be NPO after midnight tonight. Continue pain management.   Tommie Ard, PA-C Cell [phone]   03/15/2020 1:12 PM

## 2020-03-15 NOTE — Progress Notes (Signed)
Received pt from ED in stable condition. Pain currently 8/10, medicated as ordered. Daughter at bedside. Consent signed for surgery in am. Telemetry verified, will cont to monitor. SRP, RN

## 2020-03-16 ENCOUNTER — Inpatient Hospital Stay (HOSPITAL_COMMUNITY): Payer: Medicare Other

## 2020-03-16 ENCOUNTER — Encounter (HOSPITAL_COMMUNITY)
Admission: EM | Disposition: A | Discharge status: Skilled Nursing Facility | Payer: Self-pay | Source: Home / Self Care | Attending: Internal Medicine

## 2020-03-16 ENCOUNTER — Inpatient Hospital Stay (HOSPITAL_COMMUNITY): Payer: Medicare Other | Admitting: Anesthesiology

## 2020-03-16 ENCOUNTER — Encounter (HOSPITAL_COMMUNITY): Payer: Self-pay | Admitting: Family Medicine

## 2020-03-16 DIAGNOSIS — S72001A Fracture of unspecified part of neck of right femur, initial encounter for closed fracture: Secondary | ICD-10-CM

## 2020-03-16 HISTORY — PX: TOTAL HIP ARTHROPLASTY: SHX124

## 2020-03-16 LAB — CBC
HCT: 49.1 % (ref 39.0–52.0)
Hemoglobin: 15.8 g/dL (ref 13.0–17.0)
MCH: 29.9 pg (ref 26.0–34.0)
MCHC: 32.2 g/dL (ref 30.0–36.0)
MCV: 92.8 fL (ref 80.0–100.0)
Platelets: 213 10*3/uL (ref 150–400)
RBC: 5.29 MIL/uL (ref 4.22–5.81)
RDW: 15.3 % (ref 11.5–15.5)
WBC: 13.8 10*3/uL — ABNORMAL HIGH (ref 4.0–10.5)
nRBC: 0 % (ref 0.0–0.2)

## 2020-03-16 LAB — SURGICAL PCR SCREEN
MRSA, PCR: NEGATIVE
Staphylococcus aureus: NEGATIVE

## 2020-03-16 LAB — COMPREHENSIVE METABOLIC PANEL
ALT: 27 U/L (ref 0–44)
AST: 24 U/L (ref 15–41)
Albumin: 3.7 g/dL (ref 3.5–5.0)
Alkaline Phosphatase: 74 U/L (ref 38–126)
Anion gap: 7 (ref 5–15)
BUN: 25 mg/dL — ABNORMAL HIGH (ref 8–23)
CO2: 28 mmol/L (ref 22–32)
Calcium: 8.8 mg/dL — ABNORMAL LOW (ref 8.9–10.3)
Chloride: 102 mmol/L (ref 98–111)
Creatinine, Ser: 1.04 mg/dL (ref 0.61–1.24)
GFR calc Af Amer: >60 mL/min (ref >60)
GFR calc non Af Amer: >60 mL/min (ref >60)
Glucose, Bld: 131 mg/dL — ABNORMAL HIGH (ref 70–99)
Potassium: 4.6 mmol/L (ref 3.5–5.1)
Sodium: 137 mmol/L (ref 135–145)
Total Bilirubin: 1.7 mg/dL — ABNORMAL HIGH (ref 0.3–1.2)
Total Protein: 6.2 g/dL — ABNORMAL LOW (ref 6.5–8.1)

## 2020-03-16 SURGERY — ARTHROPLASTY, HIP, TOTAL, ANTERIOR APPROACH
Anesthesia: General | Site: Hip | Laterality: Right

## 2020-03-16 MED ORDER — PHENYLEPHRINE HCL (PRESSORS) 10 MG/ML IV SOLN
INTRAVENOUS | Status: AC
  Filled 2020-03-16: qty 1

## 2020-03-16 MED ORDER — PHENYLEPHRINE 40 MCG/ML (10ML) SYRINGE FOR IV PUSH (FOR BLOOD PRESSURE SUPPORT)
PREFILLED_SYRINGE | INTRAVENOUS | Status: DC | PRN
  Administered 2020-03-16: 80 ug via INTRAVENOUS

## 2020-03-16 MED ORDER — CEFAZOLIN SODIUM-DEXTROSE 2-4 GM/100ML-% IV SOLN
2.0000 g | Freq: Four times a day (QID) | INTRAVENOUS | Status: AC
  Administered 2020-03-16 – 2020-03-17 (×2): 2 g via INTRAVENOUS
  Filled 2020-03-16 (×2): qty 100

## 2020-03-16 MED ORDER — KETOROLAC TROMETHAMINE 30 MG/ML IJ SOLN
INTRAMUSCULAR | Status: DC | PRN
  Administered 2020-03-16: 30 mg

## 2020-03-16 MED ORDER — ISOPROPYL ALCOHOL 70 % SOLN
Status: DC | PRN
  Administered 2020-03-16: 1 via TOPICAL

## 2020-03-16 MED ORDER — SODIUM CHLORIDE (PF) 0.9 % IJ SOLN
INTRAMUSCULAR | Status: DC | PRN
  Administered 2020-03-16: 30 mL via INTRAVENOUS

## 2020-03-16 MED ORDER — BUPIVACAINE HCL (PF) 0.25 % IJ SOLN
INTRAMUSCULAR | Status: DC | PRN
  Administered 2020-03-16: 30 mL

## 2020-03-16 MED ORDER — DOCUSATE SODIUM 100 MG PO CAPS
100.0000 mg | ORAL_CAPSULE | Freq: Two times a day (BID) | ORAL | Status: DC
  Administered 2020-03-16 – 2020-03-18 (×3): 100 mg via ORAL
  Filled 2020-03-16 (×8): qty 1

## 2020-03-16 MED ORDER — PHENYLEPHRINE HCL-NACL 10-0.9 MG/250ML-% IV SOLN
INTRAVENOUS | Status: DC | PRN
  Administered 2020-03-16: 50 ug/min via INTRAVENOUS

## 2020-03-16 MED ORDER — ALBUTEROL SULFATE HFA 108 (90 BASE) MCG/ACT IN AERS
INHALATION_SPRAY | RESPIRATORY_TRACT | Status: AC
  Filled 2020-03-16: qty 6.7

## 2020-03-16 MED ORDER — ONDANSETRON HCL 4 MG/2ML IJ SOLN: 4.0000 mg | Freq: Four times a day (QID) | INTRAMUSCULAR | Status: DC | PRN

## 2020-03-16 MED ORDER — WATER FOR IRRIGATION, STERILE IR SOLN
Status: DC | PRN
  Administered 2020-03-16: 1000 mL

## 2020-03-16 MED ORDER — PHENOL 1.4 % MT LIQD
1.0000 | OROMUCOSAL | Status: DC | PRN
  Filled 2020-03-16: qty 177

## 2020-03-16 MED ORDER — MIDAZOLAM HCL 2 MG/2ML IJ SOLN: 0.5000 mg | Freq: Once | INTRAMUSCULAR | Status: DC | PRN

## 2020-03-16 MED ORDER — PROMETHAZINE HCL 25 MG/ML IJ SOLN: 6.2500 mg | INTRAMUSCULAR | Status: DC | PRN

## 2020-03-16 MED ORDER — LIDOCAINE 2% (20 MG/ML) 5 ML SYRINGE
INTRAMUSCULAR | Status: DC | PRN
  Administered 2020-03-16: 100 mg via INTRAVENOUS

## 2020-03-16 MED ORDER — FENTANYL CITRATE (PF) 100 MCG/2ML IJ SOLN
INTRAMUSCULAR | Status: AC
  Filled 2020-03-16: qty 2

## 2020-03-16 MED ORDER — SODIUM CHLORIDE 0.9 % IR SOLN
Status: DC | PRN
  Administered 2020-03-16: 3000 mL

## 2020-03-16 MED ORDER — PROPOFOL 500 MG/50ML IV EMUL
INTRAVENOUS | Status: DC | PRN
  Administered 2020-03-16: 170 mg via INTRAVENOUS

## 2020-03-16 MED ORDER — ALBUMIN HUMAN 5 % IV SOLN
INTRAVENOUS | Status: DC | PRN
Start: 2020-03-16 — End: 2020-03-16

## 2020-03-16 MED ORDER — SODIUM CHLORIDE 0.9 % IV SOLN
INTRAVENOUS | Status: DC | PRN
  Administered 2020-03-16: 250 mL via INTRAVENOUS

## 2020-03-16 MED ORDER — DEXAMETHASONE SODIUM PHOSPHATE 10 MG/ML IJ SOLN
INTRAMUSCULAR | Status: DC | PRN
  Administered 2020-03-16: 10 mg via INTRAVENOUS

## 2020-03-16 MED ORDER — BUPIVACAINE HCL (PF) 0.25 % IJ SOLN
INTRAMUSCULAR | Status: AC
  Filled 2020-03-16: qty 30

## 2020-03-16 MED ORDER — ONDANSETRON HCL 4 MG/2ML IJ SOLN
INTRAMUSCULAR | Status: DC | PRN
  Administered 2020-03-16: 4 mg via INTRAVENOUS

## 2020-03-16 MED ORDER — ONDANSETRON HCL 4 MG PO TABS: 4.0000 mg | ORAL_TABLET | Freq: Four times a day (QID) | ORAL | Status: DC | PRN

## 2020-03-16 MED ORDER — ALBUMIN HUMAN 5 % IV SOLN
INTRAVENOUS | Status: AC
  Filled 2020-03-16: qty 500

## 2020-03-16 MED ORDER — 0.9 % SODIUM CHLORIDE (POUR BTL) OPTIME
TOPICAL | Status: DC | PRN
  Administered 2020-03-16: 1000 mL

## 2020-03-16 MED ORDER — METOCLOPRAMIDE HCL 5 MG/ML IJ SOLN: 5.0000 mg | Freq: Three times a day (TID) | INTRAMUSCULAR | Status: DC | PRN

## 2020-03-16 MED ORDER — ALBUTEROL SULFATE HFA 108 (90 BASE) MCG/ACT IN AERS
INHALATION_SPRAY | RESPIRATORY_TRACT | Status: DC | PRN
Start: 2020-03-16 — End: 2020-03-16
  Administered 2020-03-16: 2 via RESPIRATORY_TRACT

## 2020-03-16 MED ORDER — HYDROMORPHONE HCL 1 MG/ML IJ SOLN: 0.2500 mg | INTRAMUSCULAR | Status: DC | PRN

## 2020-03-16 MED ORDER — IRRISEPT - 450ML BOTTLE WITH 0.05% CHG IN STERILE WATER, USP 99.95% OPTIME
TOPICAL | Status: DC | PRN
  Administered 2020-03-16: 450 mL

## 2020-03-16 MED ORDER — APIXABAN 5 MG PO TABS
5.0000 mg | ORAL_TABLET | Freq: Two times a day (BID) | ORAL | Status: DC
  Administered 2020-03-20 – 2020-03-21 (×3): 5 mg via ORAL
  Filled 2020-03-16 (×3): qty 1

## 2020-03-16 MED ORDER — FENTANYL CITRATE (PF) 100 MCG/2ML IJ SOLN
INTRAMUSCULAR | Status: DC | PRN
  Administered 2020-03-16 (×4): 50 ug via INTRAVENOUS
  Administered 2020-03-16 (×2): 25 ug via INTRAVENOUS

## 2020-03-16 MED ORDER — MUPIROCIN 2 % EX OINT: 1.0000 "application " | TOPICAL_OINTMENT | Freq: Two times a day (BID) | CUTANEOUS | Status: DC

## 2020-03-16 MED ORDER — METOCLOPRAMIDE HCL 5 MG PO TABS: 5.0000 mg | ORAL_TABLET | Freq: Three times a day (TID) | ORAL | Status: DC | PRN

## 2020-03-16 MED ORDER — KETOROLAC TROMETHAMINE 30 MG/ML IJ SOLN
INTRAMUSCULAR | Status: AC
  Filled 2020-03-16: qty 1

## 2020-03-16 MED ORDER — BUPIVACAINE HCL (PF) 0.5 % IJ SOLN
INTRAMUSCULAR | Status: AC
  Filled 2020-03-16: qty 60

## 2020-03-16 MED ORDER — APIXABAN 2.5 MG PO TABS: 2.5000 mg | ORAL_TABLET | Freq: Two times a day (BID) | ORAL | Status: DC

## 2020-03-16 MED ORDER — APIXABAN 2.5 MG PO TABS
2.5000 mg | ORAL_TABLET | Freq: Two times a day (BID) | ORAL | Status: AC
  Administered 2020-03-17 – 2020-03-19 (×6): 2.5 mg via ORAL
  Filled 2020-03-16 (×6): qty 1

## 2020-03-16 MED ORDER — SODIUM CHLORIDE (PF) 0.9 % IJ SOLN
INTRAMUSCULAR | Status: AC
  Filled 2020-03-16: qty 50

## 2020-03-16 MED ORDER — MENTHOL 3 MG MT LOZG: 1.0000 | LOZENGE | OROMUCOSAL | Status: DC | PRN

## 2020-03-16 SURGICAL SUPPLY — 56 items
ARTICULEZE HEAD (Hips) ×2 IMPLANT
BAG DECANTER FOR FLEXI CONT (MISCELLANEOUS) IMPLANT
BAG ZIPLOCK 12X15 (MISCELLANEOUS) IMPLANT
BLADE SURG SZ10 CARB STEEL (BLADE) IMPLANT
CHLORAPREP W/TINT 26 (MISCELLANEOUS) ×2 IMPLANT
COVER PERINEAL POST (MISCELLANEOUS) ×2 IMPLANT
COVER SURGICAL LIGHT HANDLE (MISCELLANEOUS) ×2 IMPLANT
COVER WAND RF STERILE (DRAPES) IMPLANT
CUP SECTOR GRIPTON 58MM (Orthopedic Implant) ×1 IMPLANT
DECANTER SPIKE VIAL GLASS SM (MISCELLANEOUS) ×2 IMPLANT
DERMABOND ADVANCED (GAUZE/BANDAGES/DRESSINGS) ×1
DERMABOND ADVANCED .7 DNX12 (GAUZE/BANDAGES/DRESSINGS) ×2 IMPLANT
DRAPE IMP U-DRAPE 54X76 (DRAPES) ×2 IMPLANT
DRAPE SHEET LG 3/4 BI-LAMINATE (DRAPES) ×6 IMPLANT
DRAPE STERI IOBAN 125X83 (DRAPES) IMPLANT
DRAPE U-SHAPE 47X51 STRL (DRAPES) ×4 IMPLANT
DRSG AQUACEL AG ADV 3.5X10 (GAUZE/BANDAGES/DRESSINGS) ×2 IMPLANT
ELECT REM PT RETURN 15FT ADLT (MISCELLANEOUS) ×2 IMPLANT
GAUZE SPONGE 4X4 12PLY STRL (GAUZE/BANDAGES/DRESSINGS) ×2 IMPLANT
GLOVE BIO SURGEON STRL SZ8.5 (GLOVE) ×4 IMPLANT
GLOVE BIOGEL PI IND STRL 8.5 (GLOVE) ×1 IMPLANT
GLOVE BIOGEL PI INDICATOR 8.5 (GLOVE) ×1
GOWN SPEC L3 XXLG W/TWL (GOWN DISPOSABLE) ×2 IMPLANT
HANDPIECE INTERPULSE COAX TIP (DISPOSABLE) ×1
HEAD ARTICULEZE (Hips) IMPLANT
HOLDER FOLEY CATH W/STRAP (MISCELLANEOUS) ×2 IMPLANT
HOOD PEEL AWAY FLYTE STAYCOOL (MISCELLANEOUS) ×8 IMPLANT
JET LAVAGE IRRISEPT WOUND (IRRIGATION / IRRIGATOR) ×2
KIT TURNOVER KIT A (KITS) IMPLANT
LAVAGE JET IRRISEPT WOUND (IRRIGATION / IRRIGATOR) ×1 IMPLANT
LINER NEUTRAL 52X36X58N (Liner) ×1 IMPLANT
MANIFOLD NEPTUNE II (INSTRUMENTS) ×2 IMPLANT
MARKER SKIN DUAL TIP RULER LAB (MISCELLANEOUS) ×2 IMPLANT
NDL SAFETY ECLIPSE 18X1.5 (NEEDLE) ×1 IMPLANT
NDL SPNL 18GX3.5 QUINCKE PK (NEEDLE) ×1 IMPLANT
NEEDLE HYPO 18GX1.5 SHARP (NEEDLE) ×1
NEEDLE SPNL 18GX3.5 QUINCKE PK (NEEDLE) ×2 IMPLANT
PACK ANTERIOR HIP CUSTOM (KITS) ×2 IMPLANT
PENCIL SMOKE EVACUATOR (MISCELLANEOUS) IMPLANT
SAW OSC TIP CART 19.5X105X1.3 (SAW) ×2 IMPLANT
SEALER BIPOLAR AQUA 6.0 (INSTRUMENTS) ×2 IMPLANT
SET HNDPC FAN SPRY TIP SCT (DISPOSABLE) ×1 IMPLANT
STEM FEM CMNTLSS LG AML 15.0 (Hips) ×1 IMPLANT
SUT ETHIBOND NAB CT1 #1 30IN (SUTURE) ×4 IMPLANT
SUT MNCRL AB 3-0 PS2 18 (SUTURE) ×2 IMPLANT
SUT MNCRL AB 4-0 PS2 18 (SUTURE) ×2 IMPLANT
SUT MON AB 2-0 CT1 36 (SUTURE) ×4 IMPLANT
SUT STRATAFIX PDO 1 14 VIOLET (SUTURE) ×1
SUT STRATFX PDO 1 14 VIOLET (SUTURE) ×1
SUT VIC AB 2-0 CT1 27 (SUTURE) ×1
SUT VIC AB 2-0 CT1 TAPERPNT 27 (SUTURE) ×1 IMPLANT
SUTURE STRATFX PDO 1 14 VIOLET (SUTURE) ×1 IMPLANT
SYR 3ML LL SCALE MARK (SYRINGE) ×2 IMPLANT
TRAY FOLEY MTR SLVR 16FR STAT (SET/KITS/TRAYS/PACK) IMPLANT
WATER STERILE IRR 1000ML POUR (IV SOLUTION) ×2 IMPLANT
YANKAUER SUCT BULB TIP 10FT TU (MISCELLANEOUS) ×2 IMPLANT

## 2020-03-16 NOTE — Anesthesia Postprocedure Evaluation (Signed)
Anesthesia Post Note  Patient: [name]  Procedure(s) Performed: TOTAL HIP ARTHROPLASTY ANTERIOR APPROACH (Right Hip)     Patient location during evaluation: PACU Anesthesia Type: General Level of consciousness: awake and alert Pain management: pain level controlled Vital Signs Assessment: post-procedure vital signs reviewed and stable Respiratory status: spontaneous breathing, nonlabored ventilation, respiratory function stable and patient connected to nasal cannula oxygen Cardiovascular status: blood pressure returned to baseline and stable Postop Assessment: no apparent nausea or vomiting Anesthetic complications: no    Last Vitals:  Vitals:   03/16/20 1825 03/16/20 1857  BP: 95/67 102/62  Pulse: 98 88  Resp: 16 18  Temp: 36.6 C 36.6 C  SpO2: 96% 92%    Last Pain:  Vitals:   03/16/20 1857  TempSrc: Oral  PainSc:                  Beryle Lathe

## 2020-03-16 NOTE — Discharge Instructions (Signed)
°Dr. Saryna Kneeland °Joint Replacement Specialist °[facility] °[address] °[place], [place] [address] °[phone] ° ° °TOTAL HIP REPLACEMENT POSTOPERATIVE DIRECTIONS ° ° ° °Hip Rehabilitation, Guidelines Following Surgery  ° °WEIGHT BEARING °Weight bearing as tolerated with assist device (walker, cane, etc) as directed, use it as long as suggested by your surgeon or therapist, typically at least 4-6 weeks. ° °The results of a hip operation are greatly improved after range of motion and muscle strengthening exercises. Follow all safety measures which are given to protect your hip. If any of these exercises cause increased pain or swelling in your joint, decrease the amount until you are comfortable again. Then slowly increase the exercises. Call your caregiver if you have problems or questions.  ° °HOME CARE INSTRUCTIONS  °Most of the following instructions are designed to prevent the dislocation of your new hip.  °Remove items at home which could result in a fall. This includes throw rugs or furniture in walking pathways.  °Continue medications as instructed at time of discharge. °· You may have some home medications which will be placed on hold until you complete the course of blood thinner medication. °· You may start showering once you are discharged home. Do not remove your dressing. °Do not put on socks or shoes without following the instructions of your caregivers.   °Sit on chairs with arms. Use the chair arms to help push yourself up when arising.  °Arrange for the use of a toilet seat elevator so you are not sitting low.  °· Walk with walker as instructed.  °You may resume a sexual relationship in one month or when given the OK by your caregiver.  °Use walker as long as suggested by your caregivers.  °You may put full weight on your legs and walk as much as is comfortable. °Avoid periods of inactivity such as sitting longer than an hour when not asleep. This helps prevent  blood clots.  °You may return to work once you are cleared by your surgeon.  °Do not drive a car for 6 weeks or until released by your surgeon.  °Do not drive while taking narcotics.  °Wear elastic stockings for two weeks following surgery during the day but you may remove then at night.  °Make sure you keep all of your appointments after your operation with all of your doctors and caregivers. You should call the office at the above phone number and make an appointment for approximately two weeks after the date of your surgery. °Please pick up a stool softener and laxative for home use as long as you are requiring pain medications. °· ICE to the affected hip every three hours for 30 minutes at a time and then as needed for pain and swelling. Continue to use ice on the hip for pain and swelling from surgery. You may notice swelling that will progress down to the foot and ankle.  This is normal after surgery.  Elevate the leg when you are not up walking on it.   °It is important for you to complete the blood thinner medication as prescribed by your doctor. °· Continue to use the breathing machine which will help keep your temperature down.  It is common for your temperature to cycle up and down following surgery, especially at night when you are not up moving around and exerting yourself.  The breathing machine keeps your lungs expanded and your temperature down. ° °RANGE OF MOTION AND STRENGTHENING EXERCISES  °These exercises are   designed to help you keep full movement of your hip joint. Follow your caregiver's or physical therapist's instructions. Perform all exercises about fifteen times, three times per day or as directed. Exercise both hips, even if you have had only one joint replacement. These exercises can be done on a training (exercise) mat, on the floor, on a table or on a bed. Use whatever works the best and is most comfortable for you. Use music or television while you are exercising so that the exercises  are a pleasant break in your day. This will make your life better with the exercises acting as a break in routine you can look forward to.  °Lying on your back, slowly slide your foot toward your buttocks, raising your knee up off the floor. Then slowly slide your foot back down until your leg is straight again.  °Lying on your back spread your legs as far apart as you can without causing discomfort.  °Lying on your side, raise your upper leg and foot straight up from the floor as far as is comfortable. Slowly lower the leg and repeat.  °Lying on your back, tighten up the muscle in the front of your thigh (quadriceps muscles). You can do this by keeping your leg straight and trying to raise your heel off the floor. This helps strengthen the largest muscle supporting your knee.  °Lying on your back, tighten up the muscles of your buttocks both with the legs straight and with the knee bent at a comfortable angle while keeping your heel on the floor.  ° °SKILLED REHAB INSTRUCTIONS: °If the patient is transferred to a skilled rehab facility following release from the hospital, a list of the current medications will be sent to the facility for the patient to continue.  When discharged from the skilled rehab facility, please have the facility set up the patient's Home Health Physical Therapy prior to being released. Also, the skilled facility will be responsible for providing the patient with their medications at time of release from the facility to include their pain medication and their blood thinner medication. If the patient is still at the rehab facility at time of the two week follow up appointment, the skilled rehab facility will also need to assist the patient in arranging follow up appointment in our office and any transportation needs. ° °MAKE SURE YOU:  °Understand these instructions.  °Will watch your condition.  °Will get help right away if you are not doing well or get worse. ° °Pick up stool softner and  laxative for home use following surgery while on pain medications. °Do not remove your dressing. °The dressing is waterproof--it is OK to take showers. °Continue to use ice for pain and swelling after surgery. °Do not use any lotions or creams on the incision until instructed by your surgeon. °Total Hip Protocol. ° ° °

## 2020-03-16 NOTE — Interval H&P Note (Signed)
History and Physical Interval Note:  03/16/2020 12:37 PM  Ronald Shepherd  has presented today for surgery, with the diagnosis of RIGHT FEMORAL NECK FRACTURE.  The various methods of treatment have been discussed with the patient and family. After consideration of risks, benefits and other options for treatment, the patient has consented to  Procedure(s): TOTAL HIP ARTHROPLASTY ANTERIOR APPROACH (Right) as a surgical intervention.  The patient's history has been reviewed, patient examined, no change in status, stable for surgery.  I have reviewed the patient's chart and labs.  Questions were answered to the patient's satisfaction.    The risks, benefits, and alternatives were discussed with the patient. There are risks associated with the surgery including, but not limited to, problems with anesthesia (death), infection, instability (giving out of the joint), dislocation, differences in leg length/angulation/rotation, fracture of bones, loosening or failure of implants, hematoma (blood accumulation) which may require surgical drainage, blood clots, pulmonary embolism, nerve injury (foot drop and lateral thigh numbness), and blood vessel injury. The patient understands these risks and elects to proceed.    Iline Oven Bradden Tadros

## 2020-03-16 NOTE — Anesthesia Procedure Notes (Signed)
Procedure Name: LMA Insertion Date/Time: 03/16/2020 1:55 PM Performed by: Basilio Cairo, CRNA LMA: LMA inserted LMA Size: 4.0

## 2020-03-16 NOTE — Transfer of Care (Signed)
Immediate Anesthesia Transfer of Care Note  Patient: Mcadoo Scully  Procedure(s) Performed: TOTAL HIP ARTHROPLASTY ANTERIOR APPROACH (Right Hip)  Patient Location: PACU  Anesthesia Type:General  Level of Consciousness: awake, alert , oriented and patient cooperative  Airway & Oxygen Therapy: Patient Spontanous Breathing and Patient connected to face mask oxygen  Post-op Assessment: Report given to RN, Post -op Vital signs reviewed and stable and Patient moving all extremities X 4  Post vital signs: stable  Last Vitals:  Vitals Value Taken Time  BP 99/69 03/16/20 1715  Temp    Pulse 83 03/16/20 1715  Resp 19 03/16/20 1715  SpO2 91 % 03/16/20 1715  Vitals shown include unvalidated device data.  Last Pain:  Vitals:   03/16/20 1247  TempSrc: Oral  PainSc:          Complications: No apparent anesthesia complications

## 2020-03-16 NOTE — Op Note (Signed)
OPERATIVE REPORT  SURGEON: Rod Can, MD   ASSISTANT: Nehemiah Massed, PA-C  PREOPERATIVE DIAGNOSIS: Displaced Right femoral neck fracture.   POSTOPERATIVE DIAGNOSIS: Displaced Right femoral neck fracture.   PROCEDURE: Right total hip arthroplasty, anterior approach.   IMPLANTS: DePuy AML stem, size 15 mm , large stature. DePuy Pinnacle Cup, size 58 mm. DePuy Altrx liner, size 36 by 58 mm, neutral. DePuy metal head ball, size 36 + 5 mm.  ANESTHESIA:  General  ANTIBIOTICS: 2g ancef.  ESTIMATED BLOOD LOSS:-800 mL    DRAINS: None.  COMPLICATIONS: None   CONDITION: PACU - hemodynamically stable.   BRIEF CLINICAL NOTE: Ronald Shepherd is a 74 y.o. male with a displaced Right femoral neck fracture. The patient was admitted to the hospitalist service and underwent perioperative risk stratification and medical optimization. The risks, benefits, and alternatives to total hip arthroplasty were explained, and the patient elected to proceed.  PROCEDURE IN DETAIL: The patient was taken to the operating room and general anesthesia was induced on the hospital bed.  The patient was then positioned on the Hana table.  All bony prominences were well padded.  The hip was prepped and draped in the normal sterile surgical fashion.  A time-out was called verifying side and site of surgery. Antibiotics were given within 60 minutes of beginning the procedure.  The direct anterior approach to the hip was performed through the Hueter interval.  Lateral femoral circumflex vessels were treated with the Auqumantys. The anterior capsule was exposed and an inverted T capsulotomy was made.  Fracture hematoma was encountered and evacuated. The patient was found to have a comminuted Right subcapital femoral neck fracture.  I freshened the femoral neck cut with a saw.  I removed the femoral neck fragment.  A corkscrew was placed into the head and the head was removed.  This was passed to the back table and was  measured.   Acetabular exposure was achieved, and the pulvinar and labrum were excised. Sequential reaming of the acetabulum was then performed up to a size 57 mm reamer. A 58 mm cup was then opened and impacted into place at approximately 40 degrees of abduction and 20 degrees of anteversion. The final polyethylene liner was impacted into place.    I then gained femoral exposure taking care to protect the abductors and greater trochanter.  This was performed using standard external rotation, extension, and adduction.  The capsule was peeled off the inner aspect of the greater trochanter, taking care to preserve the short external rotators. A cookie cutter was used to enter the femoral canal, and then the femoral canal finder was used to confirm location.  I then sequentially broached up to a size 9 Tri Lock broach.  Due to Dorr Type A morphology, I elected to proceed with AML stem. I reamed up to a 14.5 mm reamer and broached for a 15 mm large stature stem. I paced a trial neck and a trial head ball. The hip was reduced.  Leg lengths were checked fluoroscopically.  The hip was dislocated and trial components were removed.  I placed the real stem followed by the real spacer and head ball.  The hip was reduced.  Fluoroscopy was used to confirm component position and leg lengths.  At 90 degrees of external rotation and extension, the hip was stable to an anterior directed force.   The wound was copiously irrigated with Irrisept solution and normal saline using pule lavage.  Marcaine solution was injected into the periarticular soft  tissue.  The wound was closed in layers using #1 Vicryl and V-Loc for the fascia, 2-0 Vicryl for the subcutaneous fat, 2-0 Monocryl for the deep dermal layer, 3-0 running Monocryl subcuticular stitch and glue for the skin.  Once the glue was fully dried, an Aquacell Ag dressing was applied.  The patient was then awakened from anesthesia and transported to the recovery room in stable  condition.  Sponge, needle, and instrument counts were correct at the end of the case x2.  The patient tolerated the procedure well and there were no known complications.  Please note that a surgical assistant was a medical necessity for this procedure to perform it in a safe and expeditious manner. Assistant was necessary to provide appropriate retraction of vital neurovascular structures, to prevent femoral fracture, and to allow for anatomic placement of the prosthesis.

## 2020-03-16 NOTE — Progress Notes (Signed)
PROGRESS NOTE    Ronald Shepherd  FAO:[id number] DOB: [birth year] DOA: 03/15/2020 PCP: Endocrinology, [facility]   Chef Complaints:: Right hip pain  Brief Narrative: 74 year old male with hypertension, permanent atrial fibrillation on anticoagulation presented with a fall while getting out of recliner 7:30 AM on 03/15/2020.  He was seen in the ED, appeared hemodynamically stable and found to have right femoral neck fracture and orthopedic was consulted.  Subjective:  Npo for or today C/o some pain at rt hip. Denieis chest pain Shortness of breath  Assessment & Plan:  Closed right hip fracture status post mechanical fall, seen by orthopedic due to his Eliquis surgery planned for 03/16/20 today.  She reports at baseline is ambulatory able to meet at least MET4, uncontrolled arrhythmia no CKD no diabetes no decompensated CHF features.  He is going for intermediate risk procedure appears to be overall medically optimized.  Essential hypertension: BP controlled.  Permanent atrial fibrillation: Rate is controlled, on Eliquis at home last dose 5/18 9 PM.  Fall at home, initial encounter: PT OT eval postop.  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  Mild leukocytosis but no fever likely reactive in the setting of hip fracture.  Monitor closely.  DVT prophylaxis:SCD Code Status: full  Family Communication: plan of care discussed with patient at bedside.  Status is: Inpatient Remains inpatient appropriate because:Inpatient level of care appropriate due to severity of illness and for hip fracture surgery  Dispo: The patient is from: Home              Anticipated d/c is to: SNF              Anticipated d/c date is: 2 days              Patient currently is not medically stable to d/c.  Nutrition: Diet Order            Diet NPO time specified Except for: Sips with Meds  Diet effective midnight             Body mass index is 33.92  kg/m.  Consultants:see note  Procedures:see note Microbiology:see note  Medications: Scheduled Meds: . amLODipine  2.5 mg Oral Daily   And  . irbesartan  150 mg Oral Daily  . atenolol  25 mg Oral Daily  . chlorhexidine  60 mL Topical Once  . escitalopram  10 mg Oral Daily  . furosemide  40 mg Oral Daily  . mometasone-formoterol  2 puff Inhalation BID  . mupirocin ointment  1 application Nasal BID  . povidone-iodine  2 application Topical Once  . povidone-iodine  2 application Topical Once  . rosuvastatin  10 mg Oral QHS   Continuous Infusions: .  ceFAZolin (ANCEF) IV    . lactated ringers 75 mL/hr at 03/15/20 1629  . tranexamic acid      Antimicrobials: Anti-infectives (From admission, onward)   Start     Dose/Rate Route Frequency Ordered Stop   03/16/20 1300  ceFAZolin (ANCEF) IVPB 2g/100 mL premix     2 g 200 mL/hr over 30 Minutes Intravenous On call to O.R. 03/15/20 1523 03/17/20 0559       Objective: Vitals: Today's Vitals   03/16/20 0539 03/16/20 0736 03/16/20 0827 03/16/20 0827  BP: 110/71  113/80 113/80  Pulse: 85  74 74  Resp: 20     Temp: 98.1 F (36.7 C)   98.1 F (36.7 C)  TempSrc: Oral   Oral  SpO2: Marland Kitchen)  89% 92%    Weight:      Height:      PainSc:        Intake/Output Summary (Last 24 hours) at 03/16/2020 1024 Last data filed at 03/16/2020 0815 Gross per 24 hour  Intake 483.03 ml  Output 700 ml  Net -216.97 ml   Filed Weights   03/15/20 0908 03/15/20 1523 03/16/20 0500  Weight: 105.2 kg 105 kg 107.2 kg   Weight change:    Intake/Output from previous day: 05/19 0701 - 05/20 0700 In: 483 [P.O.:370; I.V.:113] Out: 550 [Urine:550] Intake/Output this shift: Total I/O In: 0  Out: 150 [Urine:150]  Examination:  General exam: AAOx3 ,NAD, weak appearing. HEENT:Oral mucosa moist, Ear/Nose WNL grossly,dentition normal. Respiratory system: bilaterally clear,no wheezing or crackles,no use of accessory muscle, non tender. Cardiovascular  system: S1 & S2 +, irregular, No JVD. Gastrointestinal system: Abdomen soft, NT,ND, BS+. Nervous System:Alert, awake, moving extremities and grossly nonfocal Extremities: No edema, distal peripheral pulses palpable.  Tender rt hip. Skin: No rashes,no icterus. MSK: Normal muscle bulk,tone, power  Data Reviewed: I have personally reviewed following labs and imaging studies CBC: Recent Labs  Lab 03/15/20 0926 03/16/20 0516  WBC 9.2 13.8*  NEUTROABS 7.7  --   HGB 16.2 15.8  HCT 49.2 49.1  MCV 90.6 92.8  PLT 191 518   Basic Metabolic Panel: Recent Labs  Lab 03/15/20 0926 03/15/20 1046 03/16/20 0516  NA 138  --  137  K 5.0  --  4.6  CL 106  --  102  CO2 22  --  28  GLUCOSE 126*  --  131*  BUN 22  --  25*  CREATININE 0.86  --  1.04  CALCIUM 9.1  --  8.8*  MG  --  2.2  --    GFR: Estimated Creatinine Clearance: 75.3 mL/min (by C-G formula based on SCr of 1.04 mg/dL). Liver Function Tests: Recent Labs  Lab 03/15/20 0926 03/16/20 0516  AST 33 24  ALT 28 27  ALKPHOS 78 74  BILITOT 1.2 1.7*  PROT 6.6 6.2*  ALBUMIN 4.0 3.7   No results for input(s): LIPASE, AMYLASE in the last 168 hours. No results for input(s): AMMONIA in the last 168 hours. Coagulation Profile: No results for input(s): INR, PROTIME in the last 168 hours. Cardiac Enzymes: No results for input(s): CKTOTAL, CKMB, CKMBINDEX, TROPONINI in the last 168 hours. BNP (last 3 results) No results for input(s): PROBNP in the last 8760 hours. HbA1C: No results for input(s): HGBA1C in the last 72 hours. CBG: No results for input(s): GLUCAP in the last 168 hours. Lipid Profile: No results for input(s): CHOL, HDL, LDLCALC, TRIG, CHOLHDL, LDLDIRECT in the last 72 hours. Thyroid Function Tests: No results for input(s): TSH, T4TOTAL, FREET4, T3FREE, THYROIDAB in the last 72 hours. Anemia Panel: No results for input(s): VITAMINB12, FOLATE, FERRITIN, TIBC, IRON, RETICCTPCT in the last 72 hours. Sepsis Labs: No  results for input(s): PROCALCITON, LATICACIDVEN in the last 168 hours.  Recent Results (from the past 240 hour(s))  SARS Coronavirus 2 by RT PCR (hospital order, performed in [facility] hospital lab) Nasopharyngeal Nasopharyngeal Swab     Status: None   Collection Time: 03/15/20  9:59 AM   Specimen: Nasopharyngeal Swab  Result Value Ref Range Status   SARS Coronavirus 2 NEGATIVE NEGATIVE Final    Comment: (NOTE) SARS-CoV-2 target nucleic acids are NOT DETECTED. The SARS-CoV-2 RNA is generally detectable in upper and lower respiratory specimens during the acute phase  of infection. The lowest concentration of SARS-CoV-2 viral copies this assay can detect is 250 copies / mL. A negative result does not preclude SARS-CoV-2 infection and should not be used as the sole basis for treatment or other patient management decisions.  A negative result may occur with improper specimen collection / handling, submission of specimen other than nasopharyngeal swab, presence of viral mutation(s) within the areas targeted by this assay, and inadequate number of viral copies (<250 copies / mL). A negative result must be combined with clinical observations, patient history, and epidemiological information. Fact Sheet for Patients:   BoilerBrush.com.cy Fact Sheet for Healthcare Providers: https://pope.com/ This test is not yet approved or cleared  by the [place] FDA and has been authorized for detection and/or diagnosis of SARS-CoV-2 by FDA under an Emergency Use Authorization (EUA).  This EUA will remain in effect (meaning this test can be used) for the duration of the COVID-19 declaration under Section 564(b)(1) of the Act, 21 U.S.C. section 360bbb-3(b)(1), unless the authorization is terminated or revoked sooner. Performed at [facility], [address] [place], [place] [address]       Radiology Studies: [facility] Chest Port 1  View  Result Date: 03/15/2020 CLINICAL DATA:  Recent fall with hip fracture, initial encounter EXAM: PORTABLE CHEST 1 VIEW COMPARISON:  04/28/2015 FINDINGS: Cardiac shadow is enlarged but stable. Aortic calcifications are again seen. The lungs are hyperinflated consistent with COPD but stable. No infiltrate is seen. No bony abnormality is noted. IMPRESSION: No acute abnormality noted. Electronically Signed   By: Alcide Clever M.D.   On: 03/15/2020 10:33   DG Knee Right Port  Result Date: 03/15/2020 CLINICAL DATA:  Acute RIGHT femoral neck fracture. RIGHT knee pain. EXAM: PORTABLE RIGHT KNEE - 1-2 VIEW COMPARISON:  None. FINDINGS: No evidence of acute fracture or dislocation. Well-preserved joint spaces for patient age. No visible joint effusion. Femoropopliteal and tibioperoneal artery atherosclerosis. IMPRESSION: No acute osseous abnormality. Well-preserved joint spaces. Electronically Signed   By: Hulan Saas M.D.   On: 03/15/2020 16:08   DG Hip Unilat W or Wo Pelvis 2-3 Views Right  Result Date: 03/15/2020 CLINICAL DATA:  Pain following fall EXAM: DG HIP (WITH OR WITHOUT PELVIS) 2-3V RIGHT COMPARISON:  None. FINDINGS: Frontal pelvis as well as frontal and lateral right hip images were obtained. There is a comminuted fracture of the subcapital femoral neck region with varus angulation at the fracture site. There is no other fracture appreciable. No dislocation. There is moderate symmetric narrowing of each hip joint. No erosive change. IMPRESSION: Comminuted subcapital femoral neck fracture on the right with varus angulation at the fracture site. No other fracture. No dislocation. Symmetric narrowing each hip joint. Electronically Signed   By: Bretta Bang III M.D.   On: 03/15/2020 09:52     LOS: 1 day   Lanae Boast, MD [facility]  03/16/2020, 10:24 AM

## 2020-03-17 ENCOUNTER — Encounter: Payer: Self-pay | Admitting: *Deleted

## 2020-03-17 LAB — BASIC METABOLIC PANEL
Anion gap: 4 — ABNORMAL LOW (ref 5–15)
BUN: 35 mg/dL — ABNORMAL HIGH (ref 8–23)
CO2: 30 mmol/L (ref 22–32)
Calcium: 8.1 mg/dL — ABNORMAL LOW (ref 8.9–10.3)
Chloride: 104 mmol/L (ref 98–111)
Creatinine, Ser: 1.19 mg/dL (ref 0.61–1.24)
GFR calc Af Amer: >60 mL/min (ref >60)
GFR calc non Af Amer: 59 mL/min — ABNORMAL LOW (ref >60)
Glucose, Bld: 162 mg/dL — ABNORMAL HIGH (ref 70–99)
Potassium: 5.1 mmol/L (ref 3.5–5.1)
Sodium: 138 mmol/L (ref 135–145)

## 2020-03-17 LAB — CBC
HCT: 40.6 % (ref 39.0–52.0)
Hemoglobin: 13 g/dL (ref 13.0–17.0)
MCH: 30.2 pg (ref 26.0–34.0)
MCHC: 32 g/dL (ref 30.0–36.0)
MCV: 94.4 fL (ref 80.0–100.0)
Platelets: 150 10*3/uL (ref 150–400)
RBC: 4.3 MIL/uL (ref 4.22–5.81)
RDW: 14.9 % (ref 11.5–15.5)
WBC: 17.5 10*3/uL — ABNORMAL HIGH (ref 4.0–10.5)
nRBC: 0 % (ref 0.0–0.2)

## 2020-03-17 MED ORDER — HYDROCODONE-ACETAMINOPHEN 5-325 MG PO TABS: 1.0000 | ORAL_TABLET | Freq: Four times a day (QID) | ORAL | 0 refills | Status: DC | PRN

## 2020-03-17 NOTE — Progress Notes (Signed)
    Subjective:  Patient reports pain as mild to moderate.  Denies N/V/CP/SOB. No c/o  Objective:   VITALS:   Vitals:   03/16/20 2032 03/16/20 2141 03/17/20 0152 03/17/20 0545  BP: 95/63 (!) 86/68 (!) 83/50 97/66  Pulse: (!) 103 86 70 61  Resp: 16 20 18 18   Temp: 98.6 F (37 C) 98.7 F (37.1 C) 98 F (36.7 C) 98.3 F (36.8 C)  TempSrc: Oral Oral Oral Oral  SpO2: 92% 93% 96% 95%  Weight:      Height:        NAD ABD soft Sensation intact distally Intact pulses distally Dorsiflexion/Plantar flexion intact Incision: dressing C/D/I Compartment soft   Lab Results  Component Value Date   WBC 17.5 (H) 03/17/2020   HGB 13.0 03/17/2020   HCT 40.6 03/17/2020   MCV 94.4 03/17/2020   PLT 150 03/17/2020   BMET    Component Value Date/Time   NA 138 03/17/2020 0501   K 5.1 03/17/2020 0501   CL 104 03/17/2020 0501   CO2 30 03/17/2020 0501   GLUCOSE 162 (H) 03/17/2020 0501   BUN 35 (H) 03/17/2020 0501   CREATININE 1.19 03/17/2020 0501   CALCIUM 8.1 (L) 03/17/2020 0501   GFRNONAA 59 (L) 03/17/2020 0501   GFRAA >60 03/17/2020 0501     Assessment/Plan: 1 Day Post-Op   Active Problems:   Closed right hip fracture (HCC)   Essential hypertension   Permanent atrial fibrillation (HCC)   Fall at home, initial encounter   WBAT with walker DVT ppx: apixaban, SCDs, TEDS PO pain control PT/OT Dispo: start apixaban today at 2.5 mg PO BID for 72 hrs, then resume home dose, d/c planning   03/19/2020 Ronald Shepherd 03/17/2020, 7:29 AM   03/19/2020, MD [phone] [facility] is now [facility]  [facility] [address], [address], [place], [place] Phone: [phone] [email or web address] Facebook  [phone]

## 2020-03-17 NOTE — Progress Notes (Signed)
PROGRESS NOTE    Ronald Shepherd  XKG:[id number] DOB: 1946/07/29 DOA: 03/15/2020 PCP: Endocrinology, [facility]   Chef Complaints:: Right hip pain  Brief Narrative: 74 year old male with hypertension, permanent atrial fibrillation on anticoagulation presented with a fall while getting out of recliner 7:30 AM on 03/15/2020.  He was seen in the ED, appeared hemodynamically stable and found to have right femoral neck fracture and orthopedic was consulted.  Subjective:  Pain controlled Voiding well. No fever chills  Assessment & Plan:  Closed displaced right femoral neck fracture secondary to fall.  Status post right total hip arthroplasty anterior approach  5/20.  Off Foley voiding well.  Continue to encourage oral opiates.  PT OT eval for disposition lives alone anticipating skilled nursing facility placement.  Starting Eliquis at home dose for 3 days then resume 5 mg twice daily  Hypertension: Patient is hypotensive overnight blood pressure soft in 80s. Hold amlodipine and irbesartan and cont atenolol.  Permanent atrial fibrillation with slow rate, continue atenolol, starting Eliquis now.  Fall at home continue PT.  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  COPD not examination, hypoxia needing oxygen postop.  Gradually wean off o2.  Mild leukocytosis but no fever likely reactive in the setting of hip fracture.  Monitor closely.  DVT prophylaxis:SCD.Apixaban 2.5 x 72 hrs Code Status: full  Family Communication: plan of care discussed with patient at bedside.  Status is: Inpatient Remains inpatient appropriate because:Inpatient level of care appropriate due to severity of illness and for hip fracture surgery recovery psot op and PT eval.  Dispo: The patient is from: Home              Anticipated d/c is to: SNF vs HHC- lives alone with dog.              Anticipated d/c date is:1 day              Patient currently is not medically stable to d/c.  Nutrition: Diet Order            Diet  regular Room service appropriate? Yes; Fluid consistency: Thin  Diet effective now             Body mass index is 33.92 kg/m.  Consultants:see note  Procedures:see note Microbiology:see note  Medications: Scheduled Meds: . amLODipine  2.5 mg Oral Daily   And  . irbesartan  150 mg Oral Daily  . apixaban  2.5 mg Oral BID  . [START ON 03/20/2020] apixaban  5 mg Oral BID  . atenolol  25 mg Oral Daily  . docusate sodium  100 mg Oral BID  . escitalopram  10 mg Oral Daily  . furosemide  40 mg Oral Daily  . mometasone-formoterol  2 puff Inhalation BID  . rosuvastatin  10 mg Oral QHS   Continuous Infusions: . sodium chloride 250 mL (03/16/20 2025)    Antimicrobials: Anti-infectives (From admission, onward)   Start     Dose/Rate Route Frequency Ordered Stop   03/16/20 2000  ceFAZolin (ANCEF) IVPB 2g/100 mL premix     2 g 200 mL/hr over 30 Minutes Intravenous Every 6 hours 03/16/20 1817 03/17/20 0225   03/16/20 1300  ceFAZolin (ANCEF) IVPB 2g/100 mL premix     2 g 200 mL/hr over 30 Minutes Intravenous On call to O.R. 03/15/20 1523 03/16/20 1344       Objective: Vitals: Today's Vitals   03/16/20 2141 03/17/20 0152 03/17/20 0545 03/17/20 0856  BP: (!) 86/68 (!) 83/50 97/66  Pulse: 86 70 61   Resp: 20 18 18    Temp: 98.7 F (37.1 C) 98 F (36.7 C) 98.3 F (36.8 C)   TempSrc: Oral Oral Oral   SpO2: 93% 96% 95% 95%  Weight:      Height:      PainSc:        Intake/Output Summary (Last 24 hours) at 03/17/2020 0912 Last data filed at 03/17/2020 0600 Gross per 24 hour  Intake 3606.21 ml  Output 1200 ml  Net 2406.21 ml   Filed Weights   03/15/20 0908 03/15/20 1523 03/16/20 0500  Weight: 105.2 kg 105 kg 107.2 kg   Weight change:    Intake/Output from previous day: 05/20 0701 - 05/21 0700 In: 3606.2 [P.O.:240; I.V.:2650; IV Piggyback:716.2] Out: 1350 [Urine:550; Blood:800] Intake/Output this shift: No intake/output data recorded.  Examination: General exam:  AAOx3,NAD,weak appearing. HEENT:Oral mucosa moist, Ear/Nose WNL grossly, dentition normal. Respiratory system:Bilaterally clear,no wheezing or crackles,no use of accessory muscle Cardiovascular system:S1 & S2 +, No JVD,. Gastrointestinal system:Abdomen soft, NT,ND, BS+ Nervous System:Alert, awake, moving extremities and grossly nonfocal Extremities: Rt LE surgical site w/ intact dressing, No edema, distal peripheral pulses palpable.  Skin: No rashes,no icterus. MSK: Normal muscle bulk,tone, power  Data Reviewed: I have personally reviewed following labs and imaging studies CBC: Recent Labs  Lab 03/15/20 0926 03/16/20 0516 03/17/20 0501  WBC 9.2 13.8* 17.5*  NEUTROABS 7.7  --   --   HGB 16.2 15.8 13.0  HCT 49.2 49.1 40.6  MCV 90.6 92.8 94.4  PLT 191 213 656   Basic Metabolic Panel: Recent Labs  Lab 03/15/20 0926 03/15/20 1046 03/16/20 0516 03/17/20 0501  NA 138  --  137 138  K 5.0  --  4.6 5.1  CL 106  --  102 104  CO2 22  --  28 30  GLUCOSE 126*  --  131* 162*  BUN 22  --  25* 35*  CREATININE 0.86  --  1.04 1.19  CALCIUM 9.1  --  8.8* 8.1*  MG  --  2.2  --   --    GFR: Estimated Creatinine Clearance: 65.8 mL/min (by C-G formula based on SCr of 1.19 mg/dL). Liver Function Tests: Recent Labs  Lab 03/15/20 0926 03/16/20 0516  AST 33 24  ALT 28 27  ALKPHOS 78 74  BILITOT 1.2 1.7*  PROT 6.6 6.2*  ALBUMIN 4.0 3.7   No results for input(s): LIPASE, AMYLASE in the last 168 hours. No results for input(s): AMMONIA in the last 168 hours. Coagulation Profile: No results for input(s): INR, PROTIME in the last 168 hours. Cardiac Enzymes: No results for input(s): CKTOTAL, CKMB, CKMBINDEX, TROPONINI in the last 168 hours. BNP (last 3 results) No results for input(s): PROBNP in the last 8760 hours. HbA1C: No results for input(s): HGBA1C in the last 72 hours. CBG: No results for input(s): GLUCAP in the last 168 hours. Lipid Profile: No results for input(s): CHOL,  HDL, LDLCALC, TRIG, CHOLHDL, LDLDIRECT in the last 72 hours. Thyroid Function Tests: No results for input(s): TSH, T4TOTAL, FREET4, T3FREE, THYROIDAB in the last 72 hours. Anemia Panel: No results for input(s): VITAMINB12, FOLATE, FERRITIN, TIBC, IRON, RETICCTPCT in the last 72 hours. Sepsis Labs: No results for input(s): PROCALCITON, LATICACIDVEN in the last 168 hours.  Recent Results (from the past 240 hour(s))  SARS Coronavirus 2 by RT PCR (hospital order, performed in [facility] hospital lab) Nasopharyngeal Nasopharyngeal Swab     Status: None   Collection  Time: 03/15/20  9:59 AM   Specimen: Nasopharyngeal Swab  Result Value Ref Range Status   SARS Coronavirus 2 NEGATIVE NEGATIVE Final    Comment: (NOTE) SARS-CoV-2 target nucleic acids are NOT DETECTED. The SARS-CoV-2 RNA is generally detectable in upper and lower respiratory specimens during the acute phase of infection. The lowest concentration of SARS-CoV-2 viral copies this assay can detect is 250 copies / mL. A negative result does not preclude SARS-CoV-2 infection and should not be used as the sole basis for treatment or other patient management decisions.  A negative result may occur with improper specimen collection / handling, submission of specimen other than nasopharyngeal swab, presence of viral mutation(s) within the areas targeted by this assay, and inadequate number of viral copies (<250 copies / mL). A negative result must be combined with clinical observations, patient history, and epidemiological information. Fact Sheet for Patients:   BoilerBrush.com.cy Fact Sheet for Healthcare Providers: https://pope.com/ This test is not yet approved or cleared  by the [place] FDA and has been authorized for detection and/or diagnosis of SARS-CoV-2 by FDA under an Emergency Use Authorization (EUA).  This EUA will remain in effect (meaning this test can be used) for the  duration of the COVID-19 declaration under Section 564(b)(1) of the Act, 21 U.S.C. section 360bbb-3(b)(1), unless the authorization is terminated or revoked sooner. Performed at [facility], [address] [place], [place] [address]   Surgical PCR screen     Status: None   Collection Time: 03/16/20  8:32 AM   Specimen: Nasal Mucosa; Nasal Swab  Result Value Ref Range Status   MRSA, PCR NEGATIVE NEGATIVE Final   Staphylococcus aureus NEGATIVE NEGATIVE Final    Comment: (NOTE) The Xpert SA Assay (FDA approved for NASAL specimens in patients 59 years of age and older), is one component of a comprehensive surveillance program. It is not intended to diagnose infection nor to guide or monitor treatment. Performed at [facility], [address], [place], [place] [address]       Radiology Studies: Pelvis Portable  Result Date: 03/16/2020 CLINICAL DATA:  Right hip replacement. EXAM: PORTABLE PELVIS 1-2 VIEWS COMPARISON:  Intraoperative images of earlier today. FINDINGS: Right hip arthroplasty. No acute hardware complication. Left hip located. Vascular calcifications. IMPRESSION: Expected appearance after right hip arthroplasty. Electronically Signed   By: Jeronimo Greaves M.D.   On: 03/16/2020 17:53   DG Chest Port 1 View  Result Date: 03/15/2020 CLINICAL DATA:  Recent fall with hip fracture, initial encounter EXAM: PORTABLE CHEST 1 VIEW COMPARISON:  04/28/2015 FINDINGS: Cardiac shadow is enlarged but stable. Aortic calcifications are again seen. The lungs are hyperinflated consistent with COPD but stable. No infiltrate is seen. No bony abnormality is noted. IMPRESSION: No acute abnormality noted. Electronically Signed   By: Alcide Clever M.D.   On: 03/15/2020 10:33   DG Knee Right Port  Result Date: 03/15/2020 CLINICAL DATA:  Acute RIGHT femoral neck fracture. RIGHT knee pain. EXAM: PORTABLE RIGHT KNEE - 1-2 VIEW COMPARISON:  None. FINDINGS: No evidence  of acute fracture or dislocation. Well-preserved joint spaces for patient age. No visible joint effusion. Femoropopliteal and tibioperoneal artery atherosclerosis. IMPRESSION: No acute osseous abnormality. Well-preserved joint spaces. Electronically Signed   By: Hulan Saas M.D.   On: 03/15/2020 16:08   DG C-Arm 1-60 Min-No Report  Result Date: 03/16/2020 Fluoroscopy was utilized by the requesting physician.  No radiographic interpretation.   DG HIP OPERATIVE UNILAT W OR W/O PELVIS  RIGHT  Result Date: 03/16/2020 CLINICAL DATA:  Elective surgery right hip. EXAM: OPERATIVE RIGHT HIP (WITH PELVIS IF PERFORMED) 5 VIEWS TECHNIQUE: Fluoroscopic spot image(s) were submitted for interpretation post-operatively. COMPARISON:  03/15/2020 FINDINGS: Several intraoperative fluoroscopic images over the right hip demonstrate placement of a right hip arthroplasty which is intact and normally located. Recommend correlation with findings at the time of the procedure. IMPRESSION: Expected changes post right hip arthroplasty. Electronically Signed   By: Elberta Fortis M.D.   On: 03/16/2020 16:25   DG Hip Unilat W or Wo Pelvis 2-3 Views Right  Result Date: 03/15/2020 CLINICAL DATA:  Pain following fall EXAM: DG HIP (WITH OR WITHOUT PELVIS) 2-3V RIGHT COMPARISON:  None. FINDINGS: Frontal pelvis as well as frontal and lateral right hip images were obtained. There is a comminuted fracture of the subcapital femoral neck region with varus angulation at the fracture site. There is no other fracture appreciable. No dislocation. There is moderate symmetric narrowing of each hip joint. No erosive change. IMPRESSION: Comminuted subcapital femoral neck fracture on the right with varus angulation at the fracture site. No other fracture. No dislocation. Symmetric narrowing each hip joint. Electronically Signed   By: Bretta Bang III M.D.   On: 03/15/2020 09:52     LOS: 2 days   Lanae Boast, MD [facility]  03/17/2020,  9:12 AM

## 2020-03-17 NOTE — TOC Progression Note (Signed)
Transition of Care [facility]) - Progression Note    Patient Details  Name: Paxson Harrower MRN: [id number] Date of Birth: 1946/06/26  Transition of Care [facility]) CM/SW Contact  Zavien Clubb, Olegario Messier, RN Phone Number: 03/17/2020, 3:47 PM  Clinical Narrative:  PT recc SNF. Patient agreed to SNF prefers [facility]-faxed out-await offers,then choice,then start [place].     Expected Discharge Plan: Skilled Nursing Facility Barriers to Discharge: Continued Medical Work up  Expected Discharge Plan and Services Expected Discharge Plan: Skilled Nursing Facility   Discharge Planning Services: CM Consult   Living arrangements for the past 2 months: Single Family Home                                       Social Determinants of Health (SDOH) Interventions    Readmission Risk Interventions No flowsheet data found.

## 2020-03-17 NOTE — NC FL2 (Signed)
[place] MEDICAID FL2 LEVEL OF CARE SCREENING TOOL     IDENTIFICATION  Patient Name: Ronald Shepherd Birthdate: 29-Jun-1946 Sex: male Admission Date (Current Location): 03/15/2020  [facility] and [place] Number:  Producer, television/film/video and Address:  [facility],  [address], [place] [address]      Provider Number:    Attending Physician Name and Address:  Lanae Boast, MD  Relative Name and Phone Number:  Darra Lis dtr [phone]    Current Level of Care: Hospital Recommended Level of Care: Skilled Nursing Facility Prior Approval Number:    Date Approved/Denied:   PASRR Number: [id number]  Discharge Plan: SNF    Current Diagnoses: Patient Active Problem List   Diagnosis Date Noted  . Closed right hip fracture (HCC) 03/15/2020  . Essential hypertension 03/15/2020  . Permanent atrial fibrillation (HCC) 03/15/2020  . Fall at home, initial encounter 03/15/2020    Orientation RESPIRATION BLADDER Height & Weight     Self, Time, Situation, Place  Normal Continent Weight: 107.2 kg Height:  5\' 10"  (177.8 cm)  BEHAVIORAL SYMPTOMS/MOOD NEUROLOGICAL BOWEL NUTRITION STATUS      Continent Diet(Regular)  AMBULATORY STATUS COMMUNICATION OF NEEDS Skin   Limited Assist Verbally Surgical wounds(POD#1 R total hip arthroplasty)                       Personal Care Assistance Level of Assistance  Bathing, Feeding Bathing Assistance: Limited assistance Feeding assistance: Limited assistance       Functional Limitations Info  Sight, Speech, Hearing Sight Info: Adequate Hearing Info: Adequate Speech Info: Adequate    SPECIAL CARE FACTORS FREQUENCY  PT (By licensed PT), OT (By licensed OT)     PT Frequency: 5x week OT Frequency: 5x week            Contractures Contractures Info: Not present    Additional Factors Info  Code Status, Allergies Code Status Info: Full code Allergies Info: NKA           Current Medications (03/17/2020):  This  is the current hospital active medication list Current Facility-Administered Medications  Medication Dose Route Frequency Provider Last Rate Last Admin  . 0.9 %  sodium chloride infusion   Intravenous PRN 03/19/2020, MD 10 mL/hr at 03/16/20 2025 250 mL at 03/16/20 2025  . acetaminophen (TYLENOL) tablet 650 mg  650 mg Oral Q6H PRN Swinteck, 03/18/20, MD       Or  . acetaminophen (TYLENOL) suppository 650 mg  650 mg Rectal Q6H PRN Swinteck, Arlys Markie, MD      . apixaban Arlys Drury) tablet 2.5 mg  2.5 mg Oral BID Everlene Balls T, RPH   2.5 mg at 03/17/20 0905  . [START ON 03/20/2020] apixaban (ELIQUIS) tablet 5 mg  5 mg Oral BID 03/22/2020, RPH      . docusate sodium (COLACE) capsule 100 mg  100 mg Oral BID Herby Abraham, MD   100 mg at 03/17/20 0905  . escitalopram (LEXAPRO) tablet 10 mg  10 mg Oral Daily 03/19/20, MD   10 mg at 03/17/20 0905  . furosemide (LASIX) tablet 40 mg  40 mg Oral Daily 03/19/20, MD   40 mg at 03/17/20 0905  . hydrALAZINE (APRESOLINE) injection 10 mg  10 mg Intravenous Q6H PRN Swinteck, 03/19/20, MD      . HYDROcodone-acetaminophen (NORCO/VICODIN) 5-325 MG per tablet 1-2 tablet  1-2 tablet Oral Q6H PRN Arlys Saahir, MD   1  tablet at 03/15/20 1645  . HYDROmorphone (DILAUDID) injection 0.5-1 mg  0.5-1 mg Intravenous Q2H PRN Rod Can, MD   1 mg at 03/15/20 1542  . menthol-cetylpyridinium (CEPACOL) lozenge 3 mg  1 lozenge Oral PRN Swinteck, Aaron Edelman, MD       Or  . phenol (CHLORASEPTIC) mouth spray 1 spray  1 spray Mouth/Throat PRN Swinteck, Aaron Edelman, MD      . metoCLOPramide (REGLAN) tablet 5-10 mg  5-10 mg Oral Q8H PRN Swinteck, Aaron Edelman, MD       Or  . metoCLOPramide (REGLAN) injection 5-10 mg  5-10 mg Intravenous Q8H PRN Swinteck, Aaron Edelman, MD      . mometasone-formoterol (DULERA) 200-5 MCG/ACT inhaler 2 puff  2 puff Inhalation BID Rod Can, MD   2 puff at 03/17/20 0856  . ondansetron (ZOFRAN) tablet 4 mg  4 mg Oral Q6H PRN Swinteck, Aaron Edelman, MD       Or  .  ondansetron (ZOFRAN) injection 4 mg  4 mg Intravenous Q6H PRN Swinteck, Aaron Edelman, MD      . rosuvastatin (CRESTOR) tablet 10 mg  10 mg Oral QHS Rod Can, MD   10 mg at 03/16/20 2138     Discharge Medications: Please see discharge summary for a list of discharge medications.  Relevant Imaging Results:  Relevant Lab Results:   Additional Information SS#[id number] [place], [place]

## 2020-03-17 NOTE — Evaluation (Signed)
Physical Therapy Evaluation Patient Details Name: Ronald Shepherd MRN: [id number] DOB: February 08, 1946 Today's Date: 03/17/2020   History of Present Illness  74 year old male with hypertension, permanent atrial fibrillation on anticoagulation presented with a fall while getting out of recliner 7:30 AM on 03/15/2020.  He was seen in the ED, appeared hemodynamically stable and found to have right femoral neck fracture and orthopedic was consulted. Status post right total hip arthroplasty anterior approach  5/20.    Clinical Impression  Patient is s/p above surgery resulting in functional limitations due to the deficits listed below (see PT Problem List). Patient overall performed well for POD1, however, exhibits some impulsivity and requires additional DME training for safety. Patient requires min assist and additional time for mobility. Ambulated with RW 80 feet with gait deviations and impulsivity noted. Patient does not believe his daughter will be able to provide 24/7 care for him to return home at this time. Patient will benefit from skilled PT to increase their independence and safety with mobility to allow discharge to the venue listed below.       Follow Up Recommendations SNF;Home health PT;Supervision/Assistance - 24 hour    Equipment Recommendations  3in1 (PT)    Recommendations for Other Services       Precautions / Restrictions Restrictions Weight Bearing Restrictions: Yes RLE Weight Bearing: Weight bearing as tolerated      Mobility  Bed Mobility Overal bed mobility: Needs Assistance Bed Mobility: Supine to Sit     Supine to sit: Min assist;HOB elevated     General bed mobility comments: min assist for right lower extremity  Transfers Overall transfer level: Needs assistance Equipment used: Rolling walker (2 wheeled) Transfers: Sit to/from [employer] Sit to Stand: Min assist Stand pivot transfers: Min guard;Min assist       General transfer comment:  verbal cues for sequencing of steps, placement of hands, walking within base of support  Ambulation/Gait Ambulation/Gait assistance: Min guard Gait Distance (Feet): 80 Feet Assistive device: Rolling walker (2 wheeled) Gait Pattern/deviations: Step-to pattern;Decreased step length - left;Decreased stance time - right;Decreased stride length;Decreased weight shift to right;Antalgic;Wide base of support Gait velocity: decreased   General Gait Details: slow, labored gait, somewhat impulsive attempting too long/fast steps with WBAT  Stairs            Wheelchair Mobility    Modified Rankin (Stroke Patients Only)       Balance Overall balance assessment: Needs assistance Sitting-balance support: Bilateral upper extremity supported;Feet supported Sitting balance-Leahy Scale: Good     Standing balance support: Bilateral upper extremity supported;During functional activity Standing balance-Leahy Scale: Fair Standing balance comment: with RW                             Pertinent Vitals/Pain Pain Assessment: No/denies pain    Home Living Family/patient expects to be discharged to:: Private residence Living Arrangements: Alone Available Help at Discharge: Family;Available PRN/intermittently(has a dog) Type of Home: Apartment Home Access: Level entry     Home Layout: One level Home Equipment: Walker - 2 wheels;Grab bars - tub/shower;Tub bench      Prior Function Level of Independence: Independent         Comments: since COVD, daughter has been doing the grocery shopping; patient used to walk around [facility] for 3 hours     Hand Dominance   Dominant Hand: Right    Extremity/Trunk Assessment   Upper Extremity Assessment Upper Extremity Assessment:  Overall [facility] for tasks assessed    Lower Extremity Assessment Lower Extremity Assessment: RLE deficits/detail RLE Deficits / Details: 3/5 grossly    Cervical / Trunk Assessment Cervical / Trunk  Assessment: Normal  Communication   Communication: No difficulties  Cognition Arousal/Alertness: Awake/alert Behavior During Therapy: WFL for tasks assessed/performed Overall Cognitive Status: Within Functional Limits for tasks assessed                                        General Comments General comments (skin integrity, edema, etc.): surgical dressing noted on right hip    Exercises Total Joint Exercises Ankle Circles/Pumps: AROM;Strengthening;Right;10 reps;Supine Heel Slides: AAROM;Strengthening;Right;10 reps;Supine Hip ABduction/ADduction: AAROM;Strengthening;Right;10 reps;Supine   Assessment/Plan    PT Assessment Patient needs continued PT services  PT Problem List Decreased strength;Decreased mobility;Decreased safety awareness;Decreased range of motion;Decreased activity tolerance;Decreased balance;Decreased knowledge of use of DME;Pain       PT Treatment Interventions DME instruction;Therapeutic activities;Gait training;Therapeutic exercise;Patient/family education;Balance training    PT Goals (Current goals can be found in the Care Plan section)  Acute Rehab PT Goals Patient Stated Goal: Go to rehab to get strong enough to go home. PT Goal Formulation: With patient Time For Goal Achievement: 03/31/20 Potential to Achieve Goals: Good    Frequency Min 5X/week   Barriers to discharge        Co-evaluation               AM-PAC PT "6 Clicks" Mobility  Outcome Measure Help needed turning from your back to your side while in a flat bed without using bedrails?: A Little Help needed moving from lying on your back to sitting on the side of a flat bed without using bedrails?: A Little Help needed moving to and from a bed to a chair (including a wheelchair)?: A Little Help needed standing up from a chair using your arms (e.g., wheelchair or bedside chair)?: A Little Help needed to walk in hospital room?: A Little Help needed climbing 3-5 steps  with a railing? : A Lot 6 Click Score: 17    End of Session Equipment Utilized During Treatment: Gait belt;Oxygen Activity Tolerance: Patient tolerated treatment well;Patient limited by pain;Patient limited by fatigue Patient left: in chair;with chair alarm set;with call bell/phone within reach Nurse Communication: Mobility status PT Visit Diagnosis: Unsteadiness on feet (R26.81);Other abnormalities of gait and mobility (R26.89);History of falling (Z91.81);Muscle weakness (generalized) (M62.81);Pain Pain - Right/Left: Right Pain - part of body: Hip    Time: 1420-1505 PT Time Calculation (min) (ACUTE ONLY): 45 min   Charges:   PT Evaluation $PT Eval Low Complexity: 1 Low PT Treatments $Gait Training: 8-22 mins $Therapeutic Exercise: 8-22 mins        Floria Raveling. Hartnett-Rands, MS, PT Per [place] #[id number] 03/17/2020, 3:16 PM

## 2020-03-18 LAB — BASIC METABOLIC PANEL
Anion gap: 8 (ref 5–15)
BUN: 35 mg/dL — ABNORMAL HIGH (ref 8–23)
CO2: 24 mmol/L (ref 22–32)
Calcium: 8.2 mg/dL — ABNORMAL LOW (ref 8.9–10.3)
Chloride: 105 mmol/L (ref 98–111)
Creatinine, Ser: 0.87 mg/dL (ref 0.61–1.24)
GFR calc Af Amer: >60 mL/min (ref >60)
GFR calc non Af Amer: >60 mL/min (ref >60)
Glucose, Bld: 139 mg/dL — ABNORMAL HIGH (ref 70–99)
Potassium: 4.5 mmol/L (ref 3.5–5.1)
Sodium: 137 mmol/L (ref 135–145)

## 2020-03-18 LAB — PROCALCITONIN: Procalcitonin: <0.1 ng/mL

## 2020-03-18 LAB — CBC
HCT: 37.7 % — ABNORMAL LOW (ref 39.0–52.0)
Hemoglobin: 12 g/dL — ABNORMAL LOW (ref 13.0–17.0)
MCH: 29.8 pg (ref 26.0–34.0)
MCHC: 31.8 g/dL (ref 30.0–36.0)
MCV: 93.5 fL (ref 80.0–100.0)
Platelets: 150 10*3/uL (ref 150–400)
RBC: 4.03 MIL/uL — ABNORMAL LOW (ref 4.22–5.81)
RDW: 15.1 % (ref 11.5–15.5)
WBC: 18.8 10*3/uL — ABNORMAL HIGH (ref 4.0–10.5)
nRBC: 0 % (ref 0.0–0.2)

## 2020-03-18 MED ORDER — ATENOLOL 25 MG PO TABS
25.0000 mg | ORAL_TABLET | Freq: Every day | ORAL | Status: DC
  Administered 2020-03-18 – 2020-03-21 (×4): 25 mg via ORAL
  Filled 2020-03-18 (×4): qty 1

## 2020-03-18 MED ORDER — SODIUM CHLORIDE 0.9 % IV SOLN: INTRAVENOUS | Status: DC

## 2020-03-18 NOTE — Progress Notes (Signed)
    Subjective:  Patient reports pain as mild to moderate.  Denies N/V/CP/SOB. No c/o  Objective:   VITALS:   Vitals:   03/17/20 1946 03/17/20 2200 03/17/20 2300 03/18/20 0512  BP:  105/76  101/70  Pulse:  66  64  Resp:  20 16 18   Temp:  98.2 F (36.8 C)  98.2 F (36.8 C)  TempSrc:  Oral  Oral  SpO2: 92% 94%  99%  Weight:      Height:        NAD ABD soft Sensation intact distally Intact pulses distally Dorsiflexion/Plantar flexion intact Incision: dressing C/D/I Compartment soft   Lab Results  Component Value Date   WBC 18.8 (H) 03/18/2020   HGB 12.0 (L) 03/18/2020   HCT 37.7 (L) 03/18/2020   MCV 93.5 03/18/2020   PLT 150 03/18/2020   BMET    Component Value Date/Time   NA 137 03/18/2020 0454   K 4.5 03/18/2020 0454   CL 105 03/18/2020 0454   CO2 24 03/18/2020 0454   GLUCOSE 139 (H) 03/18/2020 0454   BUN 35 (H) 03/18/2020 0454   CREATININE 0.87 03/18/2020 0454   CALCIUM 8.2 (L) 03/18/2020 0454   GFRNONAA >60 03/18/2020 0454   GFRAA >60 03/18/2020 0454     Assessment/Plan: 2 Days Post-Op   Active Problems:   Closed right hip fracture (HCC)   Essential hypertension   Permanent atrial fibrillation (HCC)   Fall at home, initial encounter   WBAT with walker DVT ppx: apixaban, SCDs, TEDS PO pain control PT/OT Dispo: start apixaban today at 2.5 mg PO BID for 72 hrs, then resume home dose, d/c planning   03/20/2020 03/18/2020, 9:54 AM  [phone] [facility] is now [facility] [address], [address], [place], [place] Phone: [phone] [email or web address] Facebook  [phone]

## 2020-03-18 NOTE — TOC Progression Note (Signed)
Transition of Care [facility]) - Progression Note    Patient Details  Name: Ronald Shepherd MRN: [id number] Date of Birth: 1946-01-18  [facility] CM/SW Contact  Lennart Pall, LCSW Phone Number: 03/18/2020, 4:38 PM  Clinical Narrative:   Met with pt and daughter, Estill Bamberg, to review SNF offers.  They have accepted from [facility].  Will need to begin insurance auth process.  Will alert pt/dtr and MD when Josem Kaufmann is received and can proceed with transfer.    Expected Discharge Plan: [place] Barriers to Discharge: Continued Medical Work up  Expected Discharge Plan and Services Expected Discharge Plan: [place]   Discharge Planning Services: CM Consult   Living arrangements for the past 2 months: Single Family Home                                       Social Determinants of Health (SDOH) Interventions    Readmission Risk Interventions No flowsheet data found.

## 2020-03-18 NOTE — Progress Notes (Signed)
PROGRESS NOTE    Ronald Shepherd  RKY:[id number] DOB: 02-10-1946 DOA: 03/15/2020 PCP: Endocrinology, [facility]   Chef Complaints: Right hip pain  Brief Narrative: 74 year old male with hypertension, permanent atrial fibrillation on anticoagulation presented with a fall while getting out of recliner 7:30 AM on 03/15/2020.  He was seen in the ED, appeared hemodynamically stable and found to have right femoral neck fracture and orthopedic was consulted.  Subjective: Seen this morning.  Patient walked with physical therapy but had some hip pain.  Heart rate 120s this morning.  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  Continue with Eliquis at 2.5 mg twice daily x 72 hr psot op  Hypertension blood pressure stable today.  Was stopped yesterday and meds were held, resuming atenolol.  Continue to hold amlodipine and irbesartan.  Permanent atrial fibrillation with RVR, resume atenolol since blood pressure is stable.  Continue Eliquis as #1.   Fall at home continue PT.  COPD/OSA continue CPAP, wean off oxygen, was placed on oxygen postop.  Leukocytosis noted worsening WBC but no fever, procalcitonin is negative.  Likely reactive and from dehydration as BUN is increasing postop.  Monitor closely.   Dehydration with increasing BUN and leukocytes count, will add gentle IV fluids overnight.  DVT prophylaxis:SCD.Apixaban 2.5 x 72 hrs Code Status: full  Family Communication: plan of care discussed with patient at bedside.  Status is: Inpatient Remains inpatient appropriate because:Inpatient level of care appropriate due to severity of illness and for hip fracture surgery recovery psot op and PT eval.  Dispo: The patient is from: Home              Anticipated d/c is to: SNF        Anticipated d/c date is:1 day              Patient currently is not medically stable to d/c.  Nutrition: Diet Order            Diet regular Room service appropriate? Yes; Fluid consistency: Thin  Diet effective now             Body mass index is 33.92 kg/m.  Consultants:see note  Procedures:see note Microbiology:see note  Medications: Scheduled Meds: . apixaban  2.5 mg Oral BID  . [START ON 03/20/2020] apixaban  5 mg Oral BID  . atenolol  25 mg Oral Daily  . docusate sodium  100 mg Oral BID  . escitalopram  10 mg Oral Daily  . furosemide  40 mg Oral Daily  . mometasone-formoterol  2 puff Inhalation BID  . rosuvastatin  10 mg Oral QHS   Continuous Infusions: . sodium chloride 250 mL (03/16/20 2025)    Antimicrobials: Anti-infectives (From admission, onward)   Start     Dose/Rate Route Frequency Ordered Stop   03/16/20 2000  ceFAZolin (ANCEF) IVPB 2g/100 mL premix     2 g 200 mL/hr over 30 Minutes Intravenous Every 6 hours 03/16/20 1817 03/17/20 0225   03/16/20 1300  ceFAZolin (ANCEF) IVPB 2g/100 mL premix     2 g 200 mL/hr over 30 Minutes Intravenous On call to O.R. 03/15/20 1523 03/16/20 1344       Objective: Vitals: Today's Vitals   03/17/20 2200 03/17/20 2300 03/18/20 0512 03/18/20 0815  BP: 105/76  101/70   Pulse:  66  64   Resp: 20 16 18    Temp: 98.2 F (36.8 C)  98.2 F (36.8 C)   TempSrc: Oral  Oral   SpO2: 94%  99%   Weight:      Height:      PainSc:    6     Intake/Output Summary (Last 24 hours) at 03/18/2020 1133 Last data filed at 03/18/2020 3151 Gross per 24 hour  Intake 240 ml  Output 1375 ml  Net -1135 ml   Filed Weights   03/15/20 0908 03/15/20 1523 03/16/20 0500  Weight: 105.2 kg 105 kg 107.2 kg   Weight change:    Intake/Output from previous day: 05/21 0701 - 05/22 0700 In: -  Out: 1200 [Urine:1200] Intake/Output this shift: Total I/O In: 240 [P.O.:240] Out: 175 [Urine:175]  Examination: General exam: AAO X4, NAD,  weak appearing. HEENT:Oral mucosa moist, Ear/Nose WNL grossly, dentition normal. Respiratory system: bilaterally clear,no wheezing or crackles,no use of accessory muscle Cardiovascular system: S1 & S2 +, No JVD,. Gastrointestinal system: Abdomen soft, NT,ND, BS+ Nervous System:Alert, awake, moving extremities and grossly nonfocal Extremities: Right hip surgical site with intact dressing, no drainage no edema, distal peripheral pulses palpable.  Skin: No rashes,no icterus. MSK: Normal muscle bulk,tone, power  Data Reviewed: I have personally reviewed following labs and imaging studies CBC: Recent Labs  Lab 03/15/20 0926 03/16/20 0516 03/17/20 0501 03/18/20 0454  WBC 9.2 13.8* 17.5* 18.8*  NEUTROABS 7.7  --   --   --   HGB 16.2 15.8 13.0 12.0*  HCT 49.2 49.1 40.6 37.7*  MCV 90.6 92.8 94.4 93.5  PLT 191 213 150 761   Basic Metabolic Panel: Recent Labs  Lab 03/15/20 0926 03/15/20 1046 03/16/20 0516 03/17/20 0501 03/18/20 0454  NA 138  --  137 138 137  K 5.0  --  4.6 5.1 4.5  CL 106  --  102 104 105  CO2 22  --  28 30 24   GLUCOSE 126*  --  131* 162* 139*  BUN 22  --  25* 35* 35*  CREATININE 0.86  --  1.04 1.19 0.87  CALCIUM 9.1  --  8.8* 8.1* 8.2*  MG  --  2.2  --   --   --    GFR: Estimated Creatinine Clearance: 90 mL/min (by C-G formula based on SCr of 0.87 mg/dL). Liver Function Tests: Recent Labs  Lab 03/15/20 0926 03/16/20 0516  AST 33 24  ALT 28 27  ALKPHOS 78 74  BILITOT 1.2 1.7*  PROT 6.6 6.2*  ALBUMIN 4.0 3.7   No results for input(s): LIPASE, AMYLASE in the last 168 hours. No results for input(s): AMMONIA in the last 168 hours. Coagulation Profile: No results for input(s): INR, PROTIME in the last 168 hours. Cardiac Enzymes: No results for input(s): CKTOTAL, CKMB, CKMBINDEX, TROPONINI in the last 168 hours. BNP (last 3 results) No results for input(s): PROBNP in the last 8760 hours. HbA1C: No results for input(s): HGBA1C in the last 72  hours. CBG: No results for input(s): GLUCAP in the last 168 hours. Lipid Profile: No results for input(s): CHOL, HDL, LDLCALC, TRIG, CHOLHDL, LDLDIRECT in the last 72 hours. Thyroid Function Tests: No results for input(s): TSH, T4TOTAL, FREET4, T3FREE, THYROIDAB in the last 72 hours. Anemia Panel: No results for input(s): VITAMINB12, FOLATE, FERRITIN, TIBC, IRON, RETICCTPCT in the last 72 hours. Sepsis Labs: Recent Labs  Lab 03/18/20 0454  PROCALCITON <0.10    Recent Results (from the past 240  hour(s))  SARS Coronavirus 2 by RT PCR (hospital order, performed in [facility] hospital lab) Nasopharyngeal Nasopharyngeal Swab     Status: None   Collection Time: 03/15/20  9:59 AM   Specimen: Nasopharyngeal Swab  Result Value Ref Range Status   SARS Coronavirus 2 NEGATIVE NEGATIVE Final    Comment: (NOTE) SARS-CoV-2 target nucleic acids are NOT DETECTED. The SARS-CoV-2 RNA is generally detectable in upper and lower respiratory specimens during the acute phase of infection. The lowest concentration of SARS-CoV-2 viral copies this assay can detect is 250 copies / mL. A negative result does not preclude SARS-CoV-2 infection and should not be used as the sole basis for treatment or other patient management decisions.  A negative result may occur with improper specimen collection / handling, submission of specimen other than nasopharyngeal swab, presence of viral mutation(s) within the areas targeted by this assay, and inadequate number of viral copies (<250 copies / mL). A negative result must be combined with clinical observations, patient history, and epidemiological information. Fact Sheet for Patients:   BoilerBrush.com.cy Fact Sheet for Healthcare Providers: https://pope.com/ This test is not yet approved or cleared  by the [place] FDA and has been authorized for detection and/or diagnosis of SARS-CoV-2 by FDA under an Emergency  Use Authorization (EUA).  This EUA will remain in effect (meaning this test can be used) for the duration of the COVID-19 declaration under Section 564(b)(1) of the Act, 21 U.S.C. section 360bbb-3(b)(1), unless the authorization is terminated or revoked sooner. Performed at [facility], [address] [place], [place] [address]   Surgical PCR screen     Status: None   Collection Time: 03/16/20  8:32 AM   Specimen: Nasal Mucosa; Nasal Swab  Result Value Ref Range Status   MRSA, PCR NEGATIVE NEGATIVE Final   Staphylococcus aureus NEGATIVE NEGATIVE Final    Comment: (NOTE) The Xpert SA Assay (FDA approved for NASAL specimens in patients 64 years of age and older), is one component of a comprehensive surveillance program. It is not intended to diagnose infection nor to guide or monitor treatment. Performed at [facility], [address] [place], [place] [address]       Radiology Studies: Pelvis Portable  Result Date: 03/16/2020 CLINICAL DATA:  Right hip replacement. EXAM: PORTABLE PELVIS 1-2 VIEWS COMPARISON:  Intraoperative images of earlier today. FINDINGS: Right hip arthroplasty. No acute hardware complication. Left hip located. Vascular calcifications. IMPRESSION: Expected appearance after right hip arthroplasty. Electronically Signed   By: Jeronimo Greaves M.D.   On: 03/16/2020 17:53   DG C-Arm 1-60 Min-No Report  Result Date: 03/16/2020 Fluoroscopy was utilized by the requesting physician.  No radiographic interpretation.   DG HIP OPERATIVE UNILAT W OR W/O PELVIS RIGHT  Result Date: 03/16/2020 CLINICAL DATA:  Elective surgery right hip. EXAM: OPERATIVE RIGHT HIP (WITH PELVIS IF PERFORMED) 5 VIEWS TECHNIQUE: Fluoroscopic spot image(s) were submitted for interpretation post-operatively. COMPARISON:  03/15/2020 FINDINGS: Several intraoperative fluoroscopic images over the right hip demonstrate placement of a right hip arthroplasty which is  intact and normally located. Recommend correlation with findings at the time of the procedure. IMPRESSION: Expected changes post right hip arthroplasty. Electronically Signed   By: Elberta Fortis M.D.   On: 03/16/2020 16:25     LOS: 3 days   Lanae Boast, MD [facility]  03/18/2020, 11:33 AM

## 2020-03-19 LAB — CBC
HCT: 38.5 % — ABNORMAL LOW (ref 39.0–52.0)
Hemoglobin: 12.3 g/dL — ABNORMAL LOW (ref 13.0–17.0)
MCH: 29.8 pg (ref 26.0–34.0)
MCHC: 31.9 g/dL (ref 30.0–36.0)
MCV: 93.2 fL (ref 80.0–100.0)
Platelets: 196 10*3/uL (ref 150–400)
RBC: 4.13 MIL/uL — ABNORMAL LOW (ref 4.22–5.81)
RDW: 15.1 % (ref 11.5–15.5)
WBC: 13.2 10*3/uL — ABNORMAL HIGH (ref 4.0–10.5)
nRBC: 0 % (ref 0.0–0.2)

## 2020-03-19 LAB — BASIC METABOLIC PANEL
Anion gap: 7 (ref 5–15)
BUN: 30 mg/dL — ABNORMAL HIGH (ref 8–23)
CO2: 29 mmol/L (ref 22–32)
Calcium: 8.3 mg/dL — ABNORMAL LOW (ref 8.9–10.3)
Chloride: 104 mmol/L (ref 98–111)
Creatinine, Ser: 0.82 mg/dL (ref 0.61–1.24)
GFR calc Af Amer: >60 mL/min (ref >60)
GFR calc non Af Amer: >60 mL/min (ref >60)
Glucose, Bld: 100 mg/dL — ABNORMAL HIGH (ref 70–99)
Potassium: 3.9 mmol/L (ref 3.5–5.1)
Sodium: 140 mmol/L (ref 135–145)

## 2020-03-19 NOTE — Progress Notes (Signed)
Subjective: 3 Days Post-Op Procedure(s) (LRB): TOTAL HIP ARTHROPLASTY ANTERIOR APPROACH (Right) Patient reports pain as mild.   Seen in rounds for Dr. Linna Caprice this am No complaints or concerns this am Denies any events overnight Denies: N/V,N/t, CP, SOB  Objective: Vital signs in last 24 hours: Temp:  [97.7 F (36.5 C)-99.6 F (37.6 C)] 97.7 F (36.5 C) (05/23 0602) Pulse Rate:  [64-77] 69 (05/23 0602) Resp:  [16-20] 16 (05/23 0602) BP: (98-114)/(60-88) 110/86 (05/23 0602) SpO2:  [95 %-99 %] 99 % (05/23 0602)  Intake/Output from previous day: 05/22 0701 - 05/23 0700 In: 1576.7 [P.O.:720; I.V.:856.7] Out: 1050 [Urine:1050] Intake/Output this shift: No intake/output data recorded.  Recent Labs    03/17/20 0501 03/18/20 0454 03/19/20 0746  HGB 13.0 12.0* 12.3*   Recent Labs    03/18/20 0454 03/19/20 0746  WBC 18.8* 13.2*  RBC 4.03* 4.13*  HCT 37.7* 38.5*  PLT 150 196   Recent Labs    03/17/20 0501 03/18/20 0454  NA 138 137  K 5.1 4.5  CL 104 105  CO2 30 24  BUN 35* 35*  CREATININE 1.19 0.87  GLUCOSE 162* 139*  CALCIUM 8.1* 8.2*   No results for input(s): LABPT, INR in the last 72 hours.  Neurologically intact Neurovascular intact Sensation intact distally Intact pulses distally Dorsiflexion/Plantar flexion intact Incision: dressing C/D/I No cellulitis present Compartment soft   Assessment/Plan: 3 Days Post-Op Procedure(s) (LRB): TOTAL HIP ARTHROPLASTY ANTERIOR APPROACH (Right) Up with therapy WBAT with walker DVT ppx: eliquis 2.5 mg today will be able to resume home dose tomorrow D/c planning     Cherie Dark , PA-C [facility] 03/19/2020, 9:01 AM

## 2020-03-19 NOTE — Progress Notes (Signed)
PROGRESS NOTE    Ronald Shepherd  CLE:[id number] DOB: September 19, 1946 DOA: 03/15/2020 PCP: Endocrinology, [facility]   Chef Complaints: Right hip pain.   Brief Narrative: 74 year old male with hypertension, permanent atrial fibrillation on anticoagulation presented with a fall while getting out of recliner 7:30 AM on 03/15/2020.  He was seen in the ED, appeared hemodynamically stable and found to have right femoral neck fracture and orthopedic was consulted.  Subjective: Resting comfortably in the bedside chair, on room air. Pain is overall stable. No fever or leukocytosis improving, tolerating p.o.   Assessment & Plan:  Closed displaced right femoral neck fracture status post right total hip arthroplasty 5/20. Doing well postop. Continue PT OT, awaiting skilled nursing facility placement. Resume Eliquis home dose tomorrow morning  Essential hypertension blood pressure controlled on home atenolol. Holding amlodipine and irbesartan.  Permanent A. fib with RVR, heart rate is stable after resuming atenolol. Continue Eliquis.  Fall at home continue PT.  COPD/OSA continue CPAP weaned off oxygen. Continue pulmonary toileting  Leukocytosis likely reactive, no fever procalcitonin near normal. Hold overnight IV hydration significantly improving.  Dehydration in the setting of postop status with elevated BUN and WBC counts-increase oral hydration stopping IV fluids today  DVT prophylaxis:SCD.Apixaban 2.5 x 72 hrs Code Status: full  Family Communication: plan of care discussed with patient at bedside.  Status is: Inpatient Remains inpatient appropriate because:Inpatient level of care appropriate due to severity of illness and for hip fracture surgery recovery psot op and PT eval. at this time awaiting for skilled nursing facility placement.  Dispo: The patient is from: Home              Anticipated d/c is to: SNF              Anticipated d/c date is:1 day              Patient currently is  medically stable for d/c  Nutrition: Diet Order            Diet regular Room service appropriate? Yes; Fluid consistency: Thin  Diet effective now             Body mass index is 33.92 kg/m.  Consultants:see note  Procedures:see note Microbiology:see note  Medications: Scheduled Meds: . apixaban  2.5 mg Oral BID  . [START ON 03/20/2020] apixaban  5 mg Oral BID  . atenolol  25 mg Oral Daily  . docusate sodium  100 mg Oral BID  . escitalopram  10 mg Oral Daily  . furosemide  40 mg Oral Daily  . mometasone-formoterol  2 puff Inhalation BID  . rosuvastatin  10 mg Oral QHS   Continuous Infusions: . sodium chloride Stopped (03/17/20 1936)  . sodium chloride 50 mL/hr at 03/19/20 9449    Antimicrobials: Anti-infectives (From admission, onward)   Start     Dose/Rate Route Frequency Ordered Stop   03/16/20 2000  ceFAZolin (ANCEF) IVPB 2g/100 mL premix     2 g 200 mL/hr over 30 Minutes Intravenous Every 6 hours 03/16/20 1817 03/17/20 0225   03/16/20 1300  ceFAZolin (ANCEF) IVPB 2g/100 mL premix     2 g 200 mL/hr over 30 Minutes Intravenous On call to O.R. 03/15/20 1523 03/16/20 1344       Objective: Vitals: Today's Vitals   03/18/20 2242 03/19/20 0026 03/19/20 0602 03/19/20 0849  BP:   110/86   Pulse:   69   Resp:  16 16   Temp:   97.7 F (36.5  C)   TempSrc:   Oral   SpO2:   99%   Weight:      Height:      PainSc: Asleep   0-No pain    Intake/Output Summary (Last 24 hours) at 03/19/2020 0941 Last data filed at 03/19/2020 0643 Gross per 24 hour  Intake 1336.71 ml  Output 875 ml  Net 461.71 ml   Filed Weights   03/15/20 0908 03/15/20 1523 03/16/20 0500  Weight: 105.2 kg 105 kg 107.2 kg   Weight change:    Intake/Output from previous day: 05/22 0701 - 05/23 0700 In: 1576.7 [P.O.:720; I.V.:856.7] Out: 1050 [Urine:1050] Intake/Output this shift: No intake/output data recorded.  Examination:  General exam: AAO x4, NAD, weak appearing. HEENT:Oral mucosa  moist, Ear/Nose WNL grossly, dentition normal. Respiratory system: bilaterally clear,no wheezing or crackles,no use of accessory muscle Cardiovascular system: S1 & S2 +, No JVD,. Gastrointestinal system: Abdomen soft, NT,ND, BS+ Nervous System:Alert, awake, moving extremities and grossly nonfocal Extremities: No edema, right hip surgical site with dressing intact, no drainage. Distal peripheral pulses palpable.  Skin: No rashes,no icterus. MSK: Normal muscle bulk,tone, power  Data Reviewed: I have personally reviewed following labs and imaging studies CBC: Recent Labs  Lab 03/15/20 0926 03/16/20 0516 03/17/20 0501 03/18/20 0454 03/19/20 0746  WBC 9.2 13.8* 17.5* 18.8* 13.2*  NEUTROABS 7.7  --   --   --   --   HGB 16.2 15.8 13.0 12.0* 12.3*  HCT 49.2 49.1 40.6 37.7* 38.5*  MCV 90.6 92.8 94.4 93.5 93.2  PLT 191 213 150 150 474   Basic Metabolic Panel: Recent Labs  Lab 03/15/20 0926 03/15/20 1046 03/16/20 0516 03/17/20 0501 03/18/20 0454 03/19/20 0746  NA 138  --  137 138 137 140  K 5.0  --  4.6 5.1 4.5 3.9  CL 106  --  102 104 105 104  CO2 22  --  28 30 24 29   GLUCOSE 126*  --  131* 162* 139* 100*  BUN 22  --  25* 35* 35* 30*  CREATININE 0.86  --  1.04 1.19 0.87 0.82  CALCIUM 9.1  --  8.8* 8.1* 8.2* 8.3*  MG  --  2.2  --   --   --   --    GFR: Estimated Creatinine Clearance: 95.5 mL/min (by C-G formula based on SCr of 0.82 mg/dL). Liver Function Tests: Recent Labs  Lab 03/15/20 0926 03/16/20 0516  AST 33 24  ALT 28 27  ALKPHOS 78 74  BILITOT 1.2 1.7*  PROT 6.6 6.2*  ALBUMIN 4.0 3.7   No results for input(s): LIPASE, AMYLASE in the last 168 hours. No results for input(s): AMMONIA in the last 168 hours. Coagulation Profile: No results for input(s): INR, PROTIME in the last 168 hours. Cardiac Enzymes: No results for input(s): CKTOTAL, CKMB, CKMBINDEX, TROPONINI in the last 168 hours. BNP (last 3 results) No results for input(s): PROBNP in the last 8760  hours. HbA1C: No results for input(s): HGBA1C in the last 72 hours. CBG: No results for input(s): GLUCAP in the last 168 hours. Lipid Profile: No results for input(s): CHOL, HDL, LDLCALC, TRIG, CHOLHDL, LDLDIRECT in the last 72 hours. Thyroid Function Tests: No results for input(s): TSH, T4TOTAL, FREET4, T3FREE, THYROIDAB in the last 72 hours. Anemia Panel: No results for input(s): VITAMINB12, FOLATE, FERRITIN, TIBC, IRON, RETICCTPCT in the last 72 hours. Sepsis Labs: Recent Labs  Lab 03/18/20 0454  PROCALCITON <0.10    Recent Results (from the past  240 hour(s))  SARS Coronavirus 2 by RT PCR (hospital order, performed in [facility] hospital lab) Nasopharyngeal Nasopharyngeal Swab     Status: None   Collection Time: 03/15/20  9:59 AM   Specimen: Nasopharyngeal Swab  Result Value Ref Range Status   SARS Coronavirus 2 NEGATIVE NEGATIVE Final    Comment: (NOTE) SARS-CoV-2 target nucleic acids are NOT DETECTED. The SARS-CoV-2 RNA is generally detectable in upper and lower respiratory specimens during the acute phase of infection. The lowest concentration of SARS-CoV-2 viral copies this assay can detect is 250 copies / mL. A negative result does not preclude SARS-CoV-2 infection and should not be used as the sole basis for treatment or other patient management decisions.  A negative result may occur with improper specimen collection / handling, submission of specimen other than nasopharyngeal swab, presence of viral mutation(s) within the areas targeted by this assay, and inadequate number of viral copies (<250 copies / mL). A negative result must be combined with clinical observations, patient history, and epidemiological information. Fact Sheet for Patients:   BoilerBrush.com.cy Fact Sheet for Healthcare Providers: https://pope.com/ This test is not yet approved or cleared  by the [place] FDA and has been authorized for  detection and/or diagnosis of SARS-CoV-2 by FDA under an Emergency Use Authorization (EUA).  This EUA will remain in effect (meaning this test can be used) for the duration of the COVID-19 declaration under Section 564(b)(1) of the Act, 21 U.S.C. section 360bbb-3(b)(1), unless the authorization is terminated or revoked sooner. Performed at [facility], [address], [place], [place] [address]   Surgical PCR screen     Status: None   Collection Time: 03/16/20  8:32 AM   Specimen: Nasal Mucosa; Nasal Swab  Result Value Ref Range Status   MRSA, PCR NEGATIVE NEGATIVE Final   Staphylococcus aureus NEGATIVE NEGATIVE Final    Comment: (NOTE) The Xpert SA Assay (FDA approved for NASAL specimens in patients 76 years of age and older), is one component of a comprehensive surveillance program. It is not intended to diagnose infection nor to guide or monitor treatment. Performed at [facility], [address], [place], [place] [address]       Radiology Studies: No results found.   LOS: 4 days   Lanae Boast, MD [facility]  03/19/2020, 9:41 AM

## 2020-03-20 NOTE — Progress Notes (Signed)
Physical Therapy Treatment Patient Details Name: Ronald Shepherd MRN: [id number] DOB: 1946/09/30 Today's Date: 03/20/2020    History of Present Illness 74 year old male with hypertension, permanent atrial fibrillation on anticoagulation presented with a fall while getting out of recliner 7:30 AM on 03/15/2020.  He was seen in the ED, appeared hemodynamically stable and found to have right femoral neck fracture and orthopedic was consulted. Status post right total hip arthroplasty anterior approach  5/20.    PT Comments    Pt with good hallway distance ambulation this session, but requires frequent breaks to recover fatigue and verbal cuing for form and safety during gait. Pt tolerated limited R THA exercises post-ambulation, requiring cuing and light assist for proper execution. PT continuing to recommend SNF level of care post-acutely to maximize functional recovery and pt safety, pt is excited to d/c to SNF. PT to continue to follow acutely.   Follow Up Recommendations  SNF;Supervision/Assistance - 24 hour     Equipment Recommendations  Other (comment)(TBD next venue)    Recommendations for Other Services       Precautions / Restrictions Precautions Precautions: Fall Restrictions Weight Bearing Restrictions: No RLE Weight Bearing: Weight bearing as tolerated    Mobility  Bed Mobility Overal bed mobility: Needs Assistance Bed Mobility: Supine to Sit     Supine to sit: Min assist     General bed mobility comments: Min assist for RLE translation and lowering off EOB, increased time and effort to scoot to EOB.  Transfers Overall transfer level: Needs assistance Equipment used: Rolling walker (2 wheeled) Transfers: Sit to/from Stand Sit to Stand: Min assist         General transfer comment: Min assist for power up, VC for hand placement when rising. Stand x2, from EOB and toilet post-BM.  Ambulation/Gait Ambulation/Gait assistance: Min guard Gait Distance (Feet): 100  Feet Assistive device: Rolling walker (2 wheeled) Gait Pattern/deviations: Decreased stance time - right;Decreased stride length;Decreased weight shift to right;Antalgic;Wide base of support;Step-through pattern Gait velocity: decr   General Gait Details: Min guard for safety, verbal cuing for placement in RW, upright posture, leading with RLE to offweight with RW as needed. Standing rest breaks x2 to recover dyspnea 2/4 with RR max 34 breaths/min, HRmax during mobility 108 bpm.   Stairs             Wheelchair Mobility    Modified Rankin (Stroke Patients Only)       Balance Overall balance assessment: Needs assistance Sitting-balance support: Bilateral upper extremity supported;Feet supported Sitting balance-Leahy Scale: Good     Standing balance support: Bilateral upper extremity supported;During functional activity Standing balance-Leahy Scale: Fair Standing balance comment: with RW                            Cognition Arousal/Alertness: Awake/alert Behavior During Therapy: WFL for tasks assessed/performed Overall Cognitive Status: Within Functional Limits for tasks assessed                                 General Comments: bed saturated in urine upon PT arrival, pt states he didn't call anyone to change it because "it wasn't that wet"      Exercises Total Joint Exercises Ankle Circles/Pumps: AROM;10 reps;Both;Seated Heel Slides: AAROM;Right;10 reps;Seated(limited ROM, with LEs elevated in recliner) Hip ABduction/ADduction: AAROM;Right;10 reps;Seated(with LEs elevated in recliner) Long Arc Quad: AROM;Right;10 reps;Seated    General Comments  Pertinent Vitals/Pain Pain Assessment: 0-10 Pain Score: 5  Pain Location: R thigh Pain Descriptors / Indicators: Sore;Discomfort;Sharp Pain Intervention(s): Limited activity within patient's tolerance;Monitored during session;Repositioned    Home Living                       Prior Function            PT Goals (current goals can now be found in the care plan section) Acute Rehab PT Goals Patient Stated Goal: Go to rehab to get strong enough to go home. PT Goal Formulation: With patient Time For Goal Achievement: 03/31/20 Potential to Achieve Goals: Good Progress towards PT goals: Progressing toward goals    Frequency    Min 5X/week      PT Plan Current plan remains appropriate    Co-evaluation              AM-PAC PT "6 Clicks" Mobility   Outcome Measure  Help needed turning from your back to your side while in a flat bed without using bedrails?: A Little Help needed moving from lying on your back to sitting on the side of a flat bed without using bedrails?: A Little Help needed moving to and from a bed to a chair (including a wheelchair)?: A Little Help needed standing up from a chair using your arms (e.g., wheelchair or bedside chair)?: A Little Help needed to walk in hospital room?: A Little Help needed climbing 3-5 steps with a railing? : A Lot 6 Click Score: 17    End of Session Equipment Utilized During Treatment: Gait belt;Oxygen Activity Tolerance: Patient tolerated treatment well;Patient limited by fatigue Patient left: in chair;with chair alarm set;with call bell/phone within reach Nurse Communication: Mobility status PT Visit Diagnosis: Other abnormalities of gait and mobility (R26.89);History of falling (Z91.81);Muscle weakness (generalized) (M62.81);Pain Pain - Right/Left: Right Pain - part of body: Hip     Time: 7106-2694 PT Time Calculation (min) (ACUTE ONLY): 32 min  Charges:  $Gait Training: 8-22 mins $Therapeutic Exercise: 8-22 mins                     Alexa E, PT Acute Rehabilitation Services Pager [phone]  Office [phone]  Alexa D Despina Hidden 03/20/2020, 10:33 AM

## 2020-03-20 NOTE — Progress Notes (Signed)
PROGRESS NOTE    Ronald Shepherd  TML:[id number] DOB: [birth year] DOA: 03/15/2020 PCP: Endocrinology, [facility]   Chef Complaints: Right hip pain.   Brief Narrative: 74 year old male with hypertension, permanent atrial fibrillation on anticoagulation presented with a fall while getting out of recliner 7:30 AM on 03/15/2020.  He was seen in the ED, appeared hemodynamically stable and found to have right femoral neck fracture and orthopedic was consulted. Patient underwent total hip arthroplasty anterior approach on the right side and doing well postop. He lives alone with dog and based on PT OT needs will need a skilled nursing facility.  Awaiting on approval for SNF.  Subjective: On the bedside chair walked with PT this morning no other issues.  Pain is controlled trying not to take narcotics and is taking Tylenol  Assessment & Plan:  Closed displaced right femoral neck fracture, underwent right total hip arthroplasty 5/20.  Doing well postop.  Continue Eliquis for DVT prophylaxis continue PT OT pain control and skilled nursing facility placement.    Hypertension:blood pressure is controlled.  Continue home atenolol holding amlodipine and and irbesartan.  Permanent A. fib with RVR, heart rate is stable after resuming atenolol. Continue Eliquis.  Fall at home continue PT.  COPD/OSA continue CPAP weaned off oxygen. Continue pulmonary toileting  Leukocytosis likely reactive, no fever procalcitonin near normal. Hold overnight IV hydration significantly improving.  Dehydration in the setting of postop status with elevated BUN and WBC counts-increase oral hydration stopping IV fluids today  DVT prophylaxis:SCD.Apixaban. Code Status: full  Family Communication: plan of care discussed with patient at bedside.  Status is: Inpatient Remains inpatient appropriate because:Inpatient level of care appropriate due to severity of illness and for hip fracture surgery recovery psot op and PT eval. at  this time awaiting for skilled nursing facility placement.  Dispo: The patient is from: Home              Anticipated d/c is to: SNF              Anticipated d/c date is:1 day              Patient currently is medically stable for d/c  Nutrition: Diet Order            Diet regular Room service appropriate? Yes; Fluid consistency: Thin  Diet effective now             Body mass index is 33.92 kg/m.  Consultants:see note  Procedures:see note Microbiology:see note  Medications: Scheduled Meds: . apixaban  5 mg Oral BID  . atenolol  25 mg Oral Daily  . docusate sodium  100 mg Oral BID  . escitalopram  10 mg Oral Daily  . furosemide  40 mg Oral Daily  . mometasone-formoterol  2 puff Inhalation BID  . rosuvastatin  10 mg Oral QHS   Continuous Infusions: . sodium chloride Stopped (03/17/20 1936)    Antimicrobials: Anti-infectives (From admission, onward)   Start     Dose/Rate Route Frequency Ordered Stop   03/16/20 2000  ceFAZolin (ANCEF) IVPB 2g/100 mL premix     2 g 200 mL/hr over 30 Minutes Intravenous Every 6 hours 03/16/20 1817 03/17/20 0225   03/16/20 1300  ceFAZolin (ANCEF) IVPB 2g/100 mL premix     2 g 200 mL/hr over 30 Minutes Intravenous On call to O.R. 03/15/20 1523 03/16/20 1344       Objective: Vitals: Today's Vitals   03/20/20 0047 03/20/20 0604 03/20/20 0723 03/20/20 0925  BP:  100/80    Pulse:  71    Resp:  16    Temp:  97.9 F (36.6 C)    TempSrc:  Oral    SpO2:  98% 96%   Weight:      Height:      PainSc: Asleep   4     Intake/Output Summary (Last 24 hours) at 03/20/2020 1326 Last data filed at 03/20/2020 1048 Gross per 24 hour  Intake 240 ml  Output 1125 ml  Net -885 ml   Filed Weights   03/15/20 0908 03/15/20 1523 03/16/20 0500  Weight: 105.2 kg 105 kg 107.2 kg   Weight change:    Intake/Output from previous day: 05/23 0701 - 05/24 0700 In: 480 [P.O.:480] Out: 1175 [Urine:1175] Intake/Output this shift: Total I/O In: 240  [P.O.:240] Out: 300 [Urine:300]  Examination:  General exam: AAO , NAD, weak appearing. HEENT:Oral mucosa moist, Ear/Nose WNL grossly, dentition normal. Respiratory system: bilaterally clear,no wheezing or crackles,no use of accessory muscle Cardiovascular system: S1 & S2 +, No JVD,. Gastrointestinal system: Abdomen soft, NT,ND, BS+ Nervous System:Alert, awake, moving extremities and grossly nonfocal Extremities: Right hip surgical site with intact dressing no edema, distal peripheral pulses palpable.  Skin: No rashes,no icterus. MSK: Normal muscle bulk,tone, power  Data Reviewed: I have personally reviewed following labs and imaging studies CBC: Recent Labs  Lab 03/15/20 0926 03/16/20 0516 03/17/20 0501 03/18/20 0454 03/19/20 0746  WBC 9.2 13.8* 17.5* 18.8* 13.2*  NEUTROABS 7.7  --   --   --   --   HGB 16.2 15.8 13.0 12.0* 12.3*  HCT 49.2 49.1 40.6 37.7* 38.5*  MCV 90.6 92.8 94.4 93.5 93.2  PLT 191 213 150 150 196   Basic Metabolic Panel: Recent Labs  Lab 03/15/20 0926 03/15/20 1046 03/16/20 0516 03/17/20 0501 03/18/20 0454 03/19/20 0746  NA 138  --  137 138 137 140  K 5.0  --  4.6 5.1 4.5 3.9  CL 106  --  102 104 105 104  CO2 22  --  28 30 24 29   GLUCOSE 126*  --  131* 162* 139* 100*  BUN 22  --  25* 35* 35* 30*  CREATININE 0.86  --  1.04 1.19 0.87 0.82  CALCIUM 9.1  --  8.8* 8.1* 8.2* 8.3*  MG  --  2.2  --   --   --   --    GFR: Estimated Creatinine Clearance: 95.5 mL/min (by C-G formula based on SCr of 0.82 mg/dL). Liver Function Tests: Recent Labs  Lab 03/15/20 0926 03/16/20 0516  AST 33 24  ALT 28 27  ALKPHOS 78 74  BILITOT 1.2 1.7*  PROT 6.6 6.2*  ALBUMIN 4.0 3.7   No results for input(s): LIPASE, AMYLASE in the last 168 hours. No results for input(s): AMMONIA in the last 168 hours. Coagulation Profile: No results for input(s): INR, PROTIME in the last 168 hours. Cardiac Enzymes: No results for input(s): CKTOTAL, CKMB, CKMBINDEX, TROPONINI  in the last 168 hours. BNP (last 3 results) No results for input(s): PROBNP in the last 8760 hours. HbA1C: No results for input(s): HGBA1C in the last 72 hours. CBG: No results for input(s): GLUCAP in the last 168 hours. Lipid Profile: No results for input(s): CHOL, HDL, LDLCALC, TRIG, CHOLHDL, LDLDIRECT in the last 72 hours. Thyroid Function Tests: No results for input(s): TSH, T4TOTAL, FREET4, T3FREE, THYROIDAB in the last 72 hours. Anemia Panel: No results for input(s): VITAMINB12, FOLATE, FERRITIN, TIBC, IRON, RETICCTPCT in  the last 72 hours. Sepsis Labs: Recent Labs  Lab 03/18/20 0454  PROCALCITON <0.10    Recent Results (from the past 240 hour(s))  SARS Coronavirus 2 by RT PCR (hospital order, performed in [facility] hospital lab) Nasopharyngeal Nasopharyngeal Swab     Status: None   Collection Time: 03/15/20  9:59 AM   Specimen: Nasopharyngeal Swab  Result Value Ref Range Status   SARS Coronavirus 2 NEGATIVE NEGATIVE Final    Comment: (NOTE) SARS-CoV-2 target nucleic acids are NOT DETECTED. The SARS-CoV-2 RNA is generally detectable in upper and lower respiratory specimens during the acute phase of infection. The lowest concentration of SARS-CoV-2 viral copies this assay can detect is 250 copies / mL. A negative result does not preclude SARS-CoV-2 infection and should not be used as the sole basis for treatment or other patient management decisions.  A negative result may occur with improper specimen collection / handling, submission of specimen other than nasopharyngeal swab, presence of viral mutation(s) within the areas targeted by this assay, and inadequate number of viral copies (<250 copies / mL). A negative result must be combined with clinical observations, patient history, and epidemiological information. Fact Sheet for Patients:   StrictlyIdeas.no Fact Sheet for Healthcare Providers: BankingDealers.co.za This  test is not yet approved or cleared  by the [place] FDA and has been authorized for detection and/or diagnosis of SARS-CoV-2 by FDA under an Emergency Use Authorization (EUA).  This EUA will remain in effect (meaning this test can be used) for the duration of the COVID-19 declaration under Section 564(b)(1) of the Act, 21 U.S.C. section 360bbb-3(b)(1), unless the authorization is terminated or revoked sooner. Performed at [facility], [place] [address] [place], [place] [address]   Surgical PCR screen     Status: None   Collection Time: 03/16/20  8:32 AM   Specimen: Nasal Mucosa; Nasal Swab  Result Value Ref Range Status   MRSA, PCR NEGATIVE NEGATIVE Final   Staphylococcus aureus NEGATIVE NEGATIVE Final    Comment: (NOTE) The Xpert SA Assay (FDA approved for NASAL specimens in patients 55 years of age and older), is one component of a comprehensive surveillance program. It is not intended to diagnose infection nor to guide or monitor treatment. Performed at [facility], [place] [address], [place], [place] [address]       Radiology Studies: No results found.   LOS: 5 days   Antonieta Pert, MD [facility]  03/20/2020, 1:26 PM

## 2020-03-21 LAB — SARS CORONAVIRUS 2 BY RT PCR (HOSPITAL ORDER, PERFORMED IN ~~LOC~~ HOSPITAL LAB): SARS Coronavirus 2: NEGATIVE

## 2020-03-21 NOTE — TOC Transition Note (Signed)
Transition of Care [facility]) - CM/SW Discharge Note   Patient Details  Name: Ronald Shepherd MRN: [id number] Date of Birth: 1946/08/12  Transition of Care [facility]) CM/SW Contact:  Lanier Clam, RN Phone Number: 03/21/2020, 12:27 PM   Clinical Narrative:  Ronald Shepherd 5/24-5/26 Ronald Shepherd [phone] fax clinicals [phone].covid neg 5/25-[facility] rep Ronald Shepherd has bed available. Await d/c summary-MD notified.     Final next level of care: Skilled Nursing Facility Barriers to Discharge: No Barriers Identified   Patient Goals and CMS Choice Patient states their goals for this hospitalization and ongoing recovery are:: go to rehab CMS Medicare.gov Compare Post Acute Care list provided to:: Patient Choice offered to / list presented to : Patient  Discharge Placement PASRR number recieved: 03/21/20            Patient chooses bed at: [facility] Patient to be transferred to facility by: PTAR Name of family member notified: Ronald Shepherd dtr [phone] Patient and family notified of of transfer: 03/21/20  Discharge Plan and Services   Discharge Planning Services: CM Consult                                 Social Determinants of Health (SDOH) Interventions     Readmission Risk Interventions No flowsheet data found.

## 2020-03-21 NOTE — Progress Notes (Signed)
Physical Therapy Treatment Patient Details Name: Ronald Shepherd MRN: [id number] DOB: 1946/08/14 Today's Date: 03/21/2020    History of Present Illness 74 year old male with hypertension, permanent atrial fibrillation on anticoagulation presented with a fall while getting out of recliner 7:30 AM on 03/15/2020.  He was seen in the ED, appeared hemodynamically stable and found to have right femoral neck fracture and orthopedic was consulted. Status post right total hip arthroplasty anterior approach  5/20.    PT Comments    POD # 5  Assisted OOB to amb.  General bed mobility comments: demonsstarted and instructed how to use a belt to asssit R LE and needed assist with upper body with increased time to complete scooting to EOB. General transfer comment: Min assist for power up, VC for hand placement when rising. Unsteady.  Uncontrolled stand to sit to recliner.General Gait Details: Very unsteady gait with decreased amb distance due to increased c/o fatigue.  HR ranged from 109 to 124 (A Fib) with increased WOB.  RA 97%.  Hx COPD. RR increased to 34.  Required an extended seated rest break. Returned to room and performed a few TE's.   Pt will need ST Rehab at SNF prior to safely returning to home.   Follow Up Recommendations  SNF     Equipment Recommendations       Recommendations for Other Services       Precautions / Restrictions Precautions Precautions: Fall Precaution Comments: Hx A Fib/COPD/sleeps in his recliner Restrictions Weight Bearing Restrictions: No RLE Weight Bearing: Weight bearing as tolerated    Mobility  Bed Mobility Overal bed mobility: Needs Assistance Bed Mobility: Supine to Sit     Supine to sit: Min assist;Mod assist     General bed mobility comments: demonsstarted and instructed how to use a belt to asssit R LE and needed assist with upper body with increased time to complete scooting to EOB.  Transfers Overall transfer level: Needs assistance Equipment  used: Rolling walker (2 wheeled) Transfers: Sit to/from Stand Sit to Stand: Min assist;+2 safety/equipment Stand pivot transfers: Min guard;Min assist;+2 safety/equipment       General transfer comment: Min assist for power up, VC for hand placement when rising. Unsteady.  Uncontrolled stand to sit to recliner.  Ambulation/Gait Ambulation/Gait assistance: Min assist Gait Distance (Feet): 48 Feet(one seated rest break) Assistive device: Rolling walker (2 wheeled) Gait Pattern/deviations: Decreased stance time - right;Decreased stride length;Decreased weight shift to right;Antalgic;Wide base of support;Step-through pattern Gait velocity: decreased   General Gait Details: Very unsteady gait with decreased amb distance due to increased c/o fatigue.  HR ranged from 109 to 124 (A Fib) with increased WOB.  RA 97%.  Hx COPD. RR increased to 34.  Required an extended seated rest break.   Stairs             Wheelchair Mobility    Modified Rankin (Stroke Patients Only)       Balance                                            Cognition Arousal/Alertness: Awake/alert Behavior During Therapy: WFL for tasks assessed/performed Overall Cognitive Status: Within Functional Limits for tasks assessed                                 General Comments:  AxO x 4 pleasant      Exercises      General Comments        Pertinent Vitals/Pain Pain Assessment: 0-10 Pain Score: 7  Pain Location: R hip Pain Descriptors / Indicators: Sore;Discomfort;Sharp;Operative site guarding Pain Intervention(s): Monitored during session;Premedicated before session;Repositioned    Home Living                      Prior Function            PT Goals (current goals can now be found in the care plan section) Progress towards PT goals: Progressing toward goals    Frequency    Min 5X/week      PT Plan Current plan remains appropriate    Co-evaluation               AM-PAC PT "6 Clicks" Mobility   Outcome Measure  Help needed turning from your back to your side while in a flat bed without using bedrails?: A Little Help needed moving from lying on your back to sitting on the side of a flat bed without using bedrails?: A Little Help needed moving to and from a bed to a chair (including a wheelchair)?: A Little Help needed standing up from a chair using your arms (e.g., wheelchair or bedside chair)?: A Lot Help needed to walk in hospital room?: A Lot Help needed climbing 3-5 steps with a railing? : Total 6 Click Score: 14    End of Session Equipment Utilized During Treatment: Gait belt Activity Tolerance: Patient limited by fatigue Patient left: in chair;with chair alarm set;with call bell/phone within reach Nurse Communication: Mobility status PT Visit Diagnosis: Other abnormalities of gait and mobility (R26.89);History of falling (Z91.81);Muscle weakness (generalized) (M62.81);Pain Pain - Right/Left: Right Pain - part of body: Hip     Time: 5732-2025 PT Time Calculation (min) (ACUTE ONLY): 23 min  Charges:  $Gait Training: 8-22 mins $Therapeutic Activity: 8-22 mins                     Rica Koyanagi  PTA Acute  Rehabilitation Services Pager      [phone] Office      [phone]

## 2020-03-21 NOTE — Plan of Care (Signed)

## 2020-03-21 NOTE — TOC Transition Note (Signed)
Transition of Care [facility]) - CM/SW Discharge Note   Patient Details  Name: Ronald Shepherd MRN: [id number] Date of Birth: 1946-03-24  [facility] CM/SW Contact:  Lanier Clam, RN Phone Number: 03/21/2020, 3:14 PM   Clinical Narrative:  Just faxed d/c summary to Diannia Ruder rm# & tel# for report.Then will call PTAR.    Final next level of care: Skilled Nursing Facility Barriers to Discharge: No Barriers Identified   Patient Goals and CMS Choice Patient states their goals for this hospitalization and ongoing recovery are:: go to rehab CMS Medicare.gov Compare Post Acute Care list provided to:: Patient Choice offered to / list presented to : Patient  Discharge Placement PASRR number recieved: 03/21/20            Patient chooses bed at: [facility] Patient to be transferred to facility by: PTAR Name of family member notified: [name] dtr [phone] Patient and family notified of of transfer: 03/21/20  Discharge Plan and Services   Discharge Planning Services: CM Consult                                 Social Determinants of Health (SDOH) Interventions     Readmission Risk Interventions No flowsheet data found.

## 2020-03-21 NOTE — Progress Notes (Signed)
Attempted to call report to [facility], received voice message, PTAR on unit to transport, IV and telemetry monitor removed, pt's daughter at bedside and will take some of his personal belongings home per request

## 2020-03-21 NOTE — TOC Transition Note (Signed)
Transition of Care [facility]) - CM/SW Discharge Note   Patient Details  Name: Ronald Shepherd MRN: [id number] Date of Birth: 1946/04/23  Transition of Care [facility]) CM/SW Contact:  Lanier Clam, RN Phone Number: 03/21/2020, 3:20 PM   Clinical Narrative:    Bertram [place] able to receive patient going to rm110,nurse to call report to [phone]-nurse [place] aware of PTAR called. No further CM needs.   Final next level of care: Skilled Nursing Facility Barriers to Discharge: No Barriers Identified   Patient Goals and CMS Choice Patient states their goals for this hospitalization and ongoing recovery are:: go to rehab CMS Medicare.gov Compare Post Acute Care list provided to:: Patient Choice offered to / list presented to : Patient  Discharge Placement PASRR number recieved: 03/21/20            Patient chooses bed at: [facility] Patient to be transferred to facility by: PTAR Name of family member notified: [name] dtr [phone] Patient and family notified of of transfer: 03/21/20  Discharge Plan and Services   Discharge Planning Services: CM Consult                                 Social Determinants of Health (SDOH) Interventions     Readmission Risk Interventions No flowsheet data found.

## 2020-03-21 NOTE — Discharge Summary (Signed)
Physician Discharge Summary  Dominique Calvey WYO:[id number] DOB: 28-Nov-1945 DOA: 03/15/2020  PCP: Endocrinology, [facility]  Admit date: 03/15/2020 Discharge date: 03/21/2020  Admitted From: home Disposition:  SNF  Recommendations for Outpatient Follow-up:  1. Follow up with PCP in 1-2 weeks 2. Please obtain BMP/CBC in one week 3. Please follow up on the following pending results: 4. Reassess the need for Exforge at SNF  Home Health:NO  Equipment/Devices: NONE  Discharge Condition: Stable Code Status: STABLE Diet recommendation:  Diet Order            Diet - low sodium heart healthy        Diet regular Room service appropriate? Yes; Fluid consistency: Thin  Diet effective now               Brief/Interim Summary:  74 year old male with hypertension, permanent atrial fibrillation on anticoagulation presented with a fall while getting out of recliner 7:30 AM on 03/15/2020.  He was seen in the ED, appeared hemodynamically stable and found to have right femoral neck fracture and orthopedic was consulted. Patient underwent total hip arthroplasty anterior approach on the right side and doing well postop. He lives alone with dog and based on PT OT needs will need a skilled nursing facility.  Awaiting on approval for SNF At this time authorization has been obtained and and he is being discharged to skilled nursing facility.  Discharge Diagnoses:     Closed right hip fracture: Status post right THA 5/20.  Doing well postop.  Continue pain control, DVT prophylaxis with Eliquis.    Essential hypertension-blood pressure at times soft continue to hold amlodipine and irbesartan, continue atenolol, home Lasix and Aldactone and follow-up at the skilled nursing facility/PCP to resume rest of the meds.    Permanent atrial fibrillation rate controlled continue atenolol, lasix, aldactone and Eliquis.  BMP in 1 week    Fall at home, initial encounter-[facility] OT at [facility]  COPD/OSA continue CPAP weaned off  oxygen. Continue pulmonary toileting  Leukocytosis likely reactive, no fever procalcitonin near normal. Wbc improving-follow-up CBC in 1 week  Dehydration in the setting of postop status -improved.    Consults:  Orthopedic surgery  Subjective: Resting well no nausea vomiting fever chills.  Pain controlled. Discharge Exam: Vitals:   03/21/20 0921 03/21/20 1254  BP: 113/78 99/65  Pulse: 71 74  Resp:  18  Temp:  98.4 F (36.9 C)  SpO2:  97%   General: Pt is alert, awake, not in acute distress Cardiovascular: RRR, S1/S2 +, no rubs, no gallops Respiratory: CTA bilaterally, no wheezing, no rhonchi Abdominal: Soft, NT, ND, bowel sounds + Extremities: no edema, no cyanosis  Discharge Instructions  Discharge Instructions    Diet - low sodium heart healthy   Complete by: As directed    Discharge instructions   Complete by: As directed    Please check BMP and CBC in 1 week at the skilled nursing facility.  Please call call MD or return to ER for similar or worsening recurring problem that brought you to hospital or if any fever,nausea/vomiting,abdominal pain, uncontrolled pain, chest pain,  shortness of breath or any other alarming symptoms.  Please follow-up your doctor as instructed in a week time and call the office for appointment.  Please avoid alcohol, smoking, or any other illicit substance and maintain healthy habits including taking your regular medications as prescribed.  You were cared for by a hospitalist during your hospital stay. If you have any questions about your discharge medications or  the care you received while you were in the hospital after you are discharged, you can call the unit and ask to speak with the hospitalist on call if the hospitalist that took care of you is not available.  Once you are discharged, your primary care physician will handle any further medical issues. Please note that NO REFILLS for any discharge medications will be authorized once  you are discharged, as it is imperative that you return to your primary care physician (or establish a relationship with a primary care physician if you do not have one) for your aftercare needs so that they can reassess your need for medications and monitor your lab values   Increase activity slowly   Complete by: As directed      Allergies as of 03/21/2020   No Known Allergies     Medication List    STOP taking these medications   amLODipine-valsartan 5-320 MG tablet Commonly known as: EXFORGE     TAKE these medications   albuterol 108 (90 Base) MCG/ACT inhaler Commonly known as: VENTOLIN HFA Inhale 2 puffs into the lungs every 4 (four) hours as needed for shortness of breath.   atenolol 25 MG tablet Commonly known as: TENORMIN Take 25 mg by mouth daily.   b complex vitamins capsule Take 1 capsule by mouth daily.   CENTRUM PO Take 1 tablet by mouth daily.   Cholecalciferol 25 MCG (1000 UT) capsule Take 1,000 Units by mouth daily.   Eliquis 5 MG Tabs tablet Generic drug: apixaban Take 5 mg by mouth 2 (two) times daily.   escitalopram 10 MG tablet Commonly known as: LEXAPRO Take 10 mg by mouth daily.   Fluticasone-Salmeterol 250-50 MCG/DOSE Aepb Commonly known as: ADVAIR Inhale 1 puff into the lungs 2 (two) times daily.   furosemide 40 MG tablet Commonly known as: LASIX Take 40 mg by mouth daily.   HYDROcodone-acetaminophen 5-325 MG tablet Commonly known as: NORCO/VICODIN Take 1 tablet by mouth every 6 (six) hours as needed for moderate pain or severe pain.   potassium chloride SA 20 MEQ tablet Commonly known as: KLOR-CON Take 20 mEq by mouth 2 (two) times daily.   rosuvastatin 10 MG tablet Commonly known as: CRESTOR Take 10 mg by mouth at bedtime.   spironolactone 25 MG tablet Commonly known as: ALDACTONE Take 12.5 mg by mouth daily.   VITAMIN C PO Take 1 tablet by mouth daily.       Contact information for follow-up providers    Swinteck,  Aaron Edelman, MD. Schedule an appointment as soon as possible for a visit in 2 weeks.   Specialty: Orthopedic Surgery Why: For wound re-check Contact information: [address] [place] [address] [phone]            Contact information for after-discharge care    Destination    [facility] Preferred SNF .   Service: Skilled Nursing Contact information: [address] [place] [phone]                 No Known Allergies  The results of significant diagnostics from this hospitalization (including imaging, microbiology, ancillary and laboratory) are listed below for reference.    Microbiology: Recent Results (from the past 240 hour(s))  SARS Coronavirus 2 by RT PCR (hospital order, performed in [facility] hospital lab) Nasopharyngeal Nasopharyngeal Swab     Status: None   Collection Time: 03/15/20  9:59 AM   Specimen: Nasopharyngeal Swab  Result Value Ref Range Status   SARS Coronavirus 2 NEGATIVE NEGATIVE Final    Comment: (NOTE) SARS-CoV-2 target nucleic acids are NOT DETECTED. The SARS-CoV-2 RNA is generally detectable in upper and lower respiratory specimens during the acute phase of infection. The lowest concentration of SARS-CoV-2 viral copies this assay can detect is 250 copies / mL. A negative result does not preclude SARS-CoV-2 infection and should not be used as the sole basis for treatment or other patient management decisions.  A negative result may occur with improper specimen collection / handling, submission of specimen other than nasopharyngeal swab, presence of viral mutation(s) within the areas targeted by this assay, and inadequate number of viral copies (<250 copies / mL). A negative result must be combined with clinical observations, patient history, and epidemiological information. Fact Sheet for Patients:   BoilerBrush.com.cy Fact Sheet for Healthcare  Providers: https://pope.com/ This test is not yet approved or cleared  by the [place] FDA and has been authorized for detection and/or diagnosis of SARS-CoV-2 by FDA under an Emergency Use Authorization (EUA).  This EUA will remain in effect (meaning this test can be used) for the duration of the COVID-19 declaration under Section 564(b)(1) of the Act, 21 U.S.C. section 360bbb-3(b)(1), unless the authorization is terminated or revoked sooner. Performed at [facility], [address] [place], [place] [address]   Surgical PCR screen     Status: None   Collection Time: 03/16/20  8:32 AM   Specimen: Nasal Mucosa; Nasal Swab  Result Value Ref Range Status   MRSA, PCR NEGATIVE NEGATIVE Final   Staphylococcus aureus NEGATIVE NEGATIVE Final    Comment: (NOTE) The Xpert SA Assay (FDA approved for NASAL specimens in patients 23 years of age and older), is one component of a comprehensive surveillance program. It is not intended to diagnose infection nor to guide or monitor treatment. Performed at [facility], [address] [place], [place] [address]   SARS Coronavirus 2 by RT PCR (hospital order, performed in [facility] hospital lab) Nasopharyngeal Nasopharyngeal Swab     Status: None   Collection Time: 03/21/20 10:23 AM   Specimen: Nasopharyngeal Swab  Result Value Ref Range Status   SARS Coronavirus 2 NEGATIVE NEGATIVE Final    Comment: (NOTE) SARS-CoV-2 target nucleic acids are NOT DETECTED. The SARS-CoV-2 RNA is generally detectable in upper and lower respiratory specimens during the acute phase of infection. The lowest concentration of SARS-CoV-2 viral copies this assay can detect is 250 copies / mL. A negative result does not preclude SARS-CoV-2 infection and should not be used as the sole basis for treatment or other patient management decisions.  A negative result may occur with improper specimen collection  / handling, submission of specimen other than nasopharyngeal swab, presence of viral mutation(s) within the areas targeted by this assay, and inadequate number of viral copies (<250 copies / mL). A negative result must be combined with clinical observations, patient history, and epidemiological information. Fact Sheet for Patients:   BoilerBrush.com.cy Fact Sheet for Healthcare Providers: https://pope.com/ This test is not yet approved or cleared  by the [place] FDA and has been authorized for detection and/or diagnosis of SARS-CoV-2 by FDA under an Emergency Use Authorization (EUA).  This EUA will remain in effect (meaning this test can be used) for the duration of the COVID-19 declaration under Section 564(b)(1) of the Act, 21 U.S.C. section 360bbb-3(b)(1), unless the authorization is terminated or revoked sooner. Performed at [facility], [address] [place], [place] [id number]     Procedures/Studies: Pelvis Portable  Result Date: 03/16/2020 CLINICAL DATA:  Right hip replacement. EXAM: PORTABLE PELVIS 1-2 VIEWS COMPARISON:  Intraoperative images of earlier today. FINDINGS: Right hip arthroplasty. No acute hardware complication. Left hip located. Vascular calcifications. IMPRESSION: Expected appearance after right hip arthroplasty. Electronically Signed   By: Jeronimo GreavesKyle  Talbot M.D.   On: 03/16/2020 17:53   DG Chest Port 1 View  Result Date: 03/15/2020 CLINICAL DATA:  Recent fall with hip fracture, initial encounter EXAM: PORTABLE CHEST 1 VIEW COMPARISON:  04/28/2015 FINDINGS: Cardiac shadow is enlarged but stable. Aortic calcifications are again seen. The lungs are hyperinflated consistent with COPD but stable. No infiltrate is seen. No bony abnormality is noted. IMPRESSION: No acute abnormality noted. Electronically Signed   By: Alcide CleverMark  Lukens M.D.   On: 03/15/2020 10:33   DG Knee Right Port  Result Date:  03/15/2020 CLINICAL DATA:  Acute RIGHT femoral neck fracture. RIGHT knee pain. EXAM: PORTABLE RIGHT KNEE - 1-2 VIEW COMPARISON:  None. FINDINGS: No evidence of acute fracture or dislocation. Well-preserved joint spaces for patient age. No visible joint effusion. Femoropopliteal and tibioperoneal artery atherosclerosis. IMPRESSION: No acute osseous abnormality. Well-preserved joint spaces. Electronically Signed   By: Hulan Saashomas  Lawrence M.D.   On: 03/15/2020 16:08   DG C-Arm 1-60 Min-No Report  Result Date: 03/16/2020 Fluoroscopy was utilized by the requesting physician.  No radiographic interpretation.   DG HIP OPERATIVE UNILAT W OR W/O PELVIS RIGHT  Result Date: 03/16/2020 CLINICAL DATA:  Elective surgery right hip. EXAM: OPERATIVE RIGHT HIP (WITH PELVIS IF PERFORMED) 5 VIEWS TECHNIQUE: Fluoroscopic spot image(s) were submitted for interpretation post-operatively. COMPARISON:  03/15/2020 FINDINGS: Several intraoperative fluoroscopic images over the right hip demonstrate placement of a right hip arthroplasty which is intact and normally located. Recommend correlation with findings at the time of the procedure. IMPRESSION: Expected changes post right hip arthroplasty. Electronically Signed   By: Elberta Fortisaniel  Boyle M.D.   On: 03/16/2020 16:25   DG Hip Unilat W or Wo Pelvis 2-3 Views Right  Result Date: 03/15/2020 CLINICAL DATA:  Pain following fall EXAM: DG HIP (WITH OR WITHOUT PELVIS) 2-3V RIGHT COMPARISON:  None. FINDINGS: Frontal pelvis as well as frontal and lateral right hip images were obtained. There is a comminuted fracture of the subcapital femoral neck region with varus angulation at the fracture site. There is no other fracture appreciable. No dislocation. There is moderate symmetric narrowing of each hip joint. No erosive change. IMPRESSION: Comminuted subcapital femoral neck fracture on the right with varus angulation at the fracture site. No other fracture. No dislocation. Symmetric narrowing each  hip joint. Electronically Signed   By: Bretta BangWilliam  Woodruff III M.D.   On: 03/15/2020 09:52    Labs: BNP (last 3 results) No results for input(s): BNP in the last 8760 hours. Basic Metabolic Panel: Recent Labs  Lab 03/15/20 0926 03/15/20 1046 03/16/20 0516 03/17/20 0501 03/18/20 0454 03/19/20 0746  NA 138  --  137 138 137 140  K 5.0  --  4.6 5.1 4.5 3.9  CL 106  --  102 104 105 104  CO2 22  --  28 30 24 29   GLUCOSE 126*  --  131* 162* 139* 100*  BUN 22  --  25* 35* 35* 30*  CREATININE 0.86  --  1.04 1.19 0.87 0.82  CALCIUM 9.1  --  8.8* 8.1* 8.2* 8.3*  MG  --  2.2  --   --   --   --  Liver Function Tests: Recent Labs  Lab 03/15/20 0926 03/16/20 0516  AST 33 24  ALT 28 27  ALKPHOS 78 74  BILITOT 1.2 1.7*  PROT 6.6 6.2*  ALBUMIN 4.0 3.7   No results for input(s): LIPASE, AMYLASE in the last 168 hours. No results for input(s): AMMONIA in the last 168 hours. CBC: Recent Labs  Lab 03/15/20 0926 03/16/20 0516 03/17/20 0501 03/18/20 0454 03/19/20 0746  WBC 9.2 13.8* 17.5* 18.8* 13.2*  NEUTROABS 7.7  --   --   --   --   HGB 16.2 15.8 13.0 12.0* 12.3*  HCT 49.2 49.1 40.6 37.7* 38.5*  MCV 90.6 92.8 94.4 93.5 93.2  PLT 191 213 150 150 196   Cardiac Enzymes: No results for input(s): CKTOTAL, CKMB, CKMBINDEX, TROPONINI in the last 168 hours. BNP: Invalid input(s): POCBNP CBG: No results for input(s): GLUCAP in the last 168 hours. D-Dimer No results for input(s): DDIMER in the last 72 hours. Hgb A1c No results for input(s): HGBA1C in the last 72 hours. Lipid Profile No results for input(s): CHOL, HDL, LDLCALC, TRIG, CHOLHDL, LDLDIRECT in the last 72 hours. Thyroid function studies No results for input(s): TSH, T4TOTAL, T3FREE, THYROIDAB in the last 72 hours.  Invalid input(s): FREET3 Anemia work up No results for input(s): VITAMINB12, FOLATE, FERRITIN, TIBC, IRON, RETICCTPCT in the last 72 hours. Urinalysis No results found for: COLORURINE, APPEARANCEUR,  LABSPEC, PHURINE, GLUCOSEU, HGBUR, BILIRUBINUR, KETONESUR, PROTEINUR, UROBILINOGEN, NITRITE, LEUKOCYTESUR Sepsis Labs Invalid input(s): PROCALCITONIN,  WBC,  LACTICIDVEN Microbiology Recent Results (from the past 240 hour(s))  SARS Coronavirus 2 by RT PCR (hospital order, performed in [facility] hospital lab) Nasopharyngeal Nasopharyngeal Swab     Status: None   Collection Time: 03/15/20  9:59 AM   Specimen: Nasopharyngeal Swab  Result Value Ref Range Status   SARS Coronavirus 2 NEGATIVE NEGATIVE Final    Comment: (NOTE) SARS-CoV-2 target nucleic acids are NOT DETECTED. The SARS-CoV-2 RNA is generally detectable in upper and lower respiratory specimens during the acute phase of infection. The lowest concentration of SARS-CoV-2 viral copies this assay can detect is 250 copies / mL. A negative result does not preclude SARS-CoV-2 infection and should not be used as the sole basis for treatment or other patient management decisions.  A negative result may occur with improper specimen collection / handling, submission of specimen other than nasopharyngeal swab, presence of viral mutation(s) within the areas targeted by this assay, and inadequate number of viral copies (<250 copies / mL). A negative result must be combined with clinical observations, patient history, and epidemiological information. Fact Sheet for Patients:   BoilerBrush.com.cy Fact Sheet for Healthcare Providers: https://pope.com/ This test is not yet approved or cleared  by the [place] FDA and has been authorized for detection and/or diagnosis of SARS-CoV-2 by FDA under an Emergency Use Authorization (EUA).  This EUA will remain in effect (meaning this test can be used) for the duration of the COVID-19 declaration under Section 564(b)(1) of the Act, 21 U.S.C. section 360bbb-3(b)(1), unless the authorization is terminated or revoked sooner. Performed at [facility], [address] [place], [place] [address]   Surgical PCR screen     Status: None   Collection Time: 03/16/20  8:32 AM   Specimen: Nasal Mucosa; Nasal Swab  Result Value Ref Range Status   MRSA, PCR NEGATIVE NEGATIVE Final   Staphylococcus aureus NEGATIVE NEGATIVE Final    Comment: (NOTE) The Xpert SA Assay (FDA approved for NASAL specimens  in patients 57 years of age and older), is one component of a comprehensive surveillance program. It is not intended to diagnose infection nor to guide or monitor treatment. Performed at [facility], [address], [place], [place] [address]   SARS Coronavirus 2 by RT PCR (hospital order, performed in [facility] hospital lab) Nasopharyngeal Nasopharyngeal Swab     Status: None   Collection Time: 03/21/20 10:23 AM   Specimen: Nasopharyngeal Swab  Result Value Ref Range Status   SARS Coronavirus 2 NEGATIVE NEGATIVE Final    Comment: (NOTE) SARS-CoV-2 target nucleic acids are NOT DETECTED. The SARS-CoV-2 RNA is generally detectable in upper and lower respiratory specimens during the acute phase of infection. The lowest concentration of SARS-CoV-2 viral copies this assay can detect is 250 copies / mL. A negative result does not preclude SARS-CoV-2 infection and should not be used as the sole basis for treatment or other patient management decisions.  A negative result may occur with improper specimen collection / handling, submission of specimen other than nasopharyngeal swab, presence of viral mutation(s) within the areas targeted by this assay, and inadequate number of viral copies (<250 copies / mL). A negative result must be combined with clinical observations, patient history, and epidemiological information. Fact Sheet for Patients:   BoilerBrush.com.cy Fact Sheet for Healthcare Providers: https://pope.com/ This test is not yet approved or cleared  by  the [place] FDA and has been authorized for detection and/or diagnosis of SARS-CoV-2 by FDA under an Emergency Use Authorization (EUA).  This EUA will remain in effect (meaning this test can be used) for the duration of the COVID-19 declaration under Section 564(b)(1) of the Act, 21 U.S.C. section 360bbb-3(b)(1), unless the authorization is terminated or revoked sooner. Performed at [facility], [address] [place], [place] [address]      Time coordinating discharge: 25  minutes  SIGNED: Lanae Boast, MD  [facility] 03/21/2020, 3:02 PM  If 7PM-7AM, please contact night-coverage [email or web address]

## 2020-03-21 NOTE — Care Management Important Message (Signed)
Important Message  Patient Details IM Letter given to Lanier Clam RN Case Manager to present to the Patient Name: Ronald Shepherd MRN: [id number] Date of Birth: 1946-09-27   Medicare Important Message Given:  Yes     Caren Macadam 03/21/2020, 11:46 AM

## 2020-03-22 ENCOUNTER — Encounter: Payer: Self-pay | Admitting: Internal Medicine

## 2020-03-22 ENCOUNTER — Non-Acute Institutional Stay (SKILLED_NURSING_FACILITY): Payer: Medicare Other | Admitting: Internal Medicine

## 2020-03-22 DIAGNOSIS — F329 Major depressive disorder, single episode, unspecified: Secondary | ICD-10-CM

## 2020-03-22 DIAGNOSIS — I4821 Permanent atrial fibrillation: Secondary | ICD-10-CM

## 2020-03-22 DIAGNOSIS — J42 Unspecified chronic bronchitis: Secondary | ICD-10-CM

## 2020-03-22 DIAGNOSIS — S72001A Fracture of unspecified part of neck of right femur, initial encounter for closed fracture: Secondary | ICD-10-CM

## 2020-03-22 DIAGNOSIS — I10 Essential (primary) hypertension: Secondary | ICD-10-CM | POA: Diagnosis not present

## 2020-03-22 DIAGNOSIS — F32A Depression, unspecified: Secondary | ICD-10-CM

## 2020-03-22 NOTE — Progress Notes (Signed)
Location:    [facility] Room Number: 110/P Place of Service:  SNF (31) Provider: Jeannie Done, DO  Patient Care Team: Kermit Balo, DO as PCP - General (Geriatric Medicine)  Extended Emergency Contact Information Primary Emergency Contact: Darra Lis Home Phone: [phone] Relation: Daughter  Code Status:  Full Code Goals of care: Advanced Directive information Advanced Directives 03/22/2020  Does Patient Have a Medical Advance Directive? Yes  Type of Advance Directive (No Data)  Does patient want to make changes to medical advance directive? No - Patient declined  Would patient like information on creating a medical advance directive? -     Chief Complaint  Patient presents with  . Hospitalization Follow-up    Hospitalization Follow Up  Status post hospitalization for right femoral neck fracture status post repair.    HPI:  Pt is a 74 y.o. male seen today for hospital follow-up after hospitalization for a closed right hip fracture that was surgically repaired on May 20.  He sustained a fracture after a fall out of his recliner.  The procedure apparently went unremarkably he did well-he has DVT prophylaxis with Eliquis.  He is also on Eliquis for history of atrial fibrillation.  Postop his Norvasc and irbesartan were held secondary to low blood pressures atenolol was continued as well as Lasix and Aldactone.  -His other diagnoses he does have a history of COPD and does have CPAP as well as albuterol inhaler as needed and Advair twice daily.  His white count was elevated in the hospital but this was thought due to reactive status he did not really show evidence apparently of any significant infection.  He also has a history of depression and continues on Lexapro.  Currently he is sitting in his chair comfortably does not really have any complaints he does have edema of his right leg.  He is not really  complaining of pain at this time.  He does have orders for Vicodin as needed every 6 hours  Past Medical History:  Diagnosis Date  . COPD (chronic obstructive pulmonary disease) (HCC)   . Hypertension    Past Surgical History:  Procedure Laterality Date  . 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 If you have any questions, ask your nurse or doctor.        albuterol 108 (90 Base) MCG/ACT inhaler Commonly known as: VENTOLIN HFA Inhale 2 puffs into the lungs every 4 (four) hours as needed for shortness of breath.   atenolol 25 MG tablet Commonly known as: TENORMIN Take 25 mg by mouth daily.   b complex vitamins capsule Take 1 capsule by mouth daily.   CENTRUM PO Take 1 tablet by mouth daily.   Cholecalciferol 25 MCG (1000 UT) capsule Take 1,000 Units by mouth daily.   Eliquis 5 MG Tabs tablet Generic drug: apixaban Take 5 mg by mouth 2 (two) times daily.   escitalopram 10 MG tablet Commonly known as: LEXAPRO Take 10 mg by mouth daily.   Fluticasone-Salmeterol 250-50 MCG/DOSE Aepb Commonly known as: ADVAIR Inhale 1 puff into the lungs 2 (two) times daily.   furosemide  40 MG tablet Commonly known as: LASIX Take 40 mg by mouth daily.   HYDROcodone-acetaminophen 5-325 MG tablet Commonly known as: NORCO/VICODIN Take 1 tablet by mouth every 6 (six) hours as needed for moderate pain or severe pain.   potassium chloride SA 20 MEQ tablet Commonly known as: KLOR-CON Take 20 mEq by mouth 2 (two) times daily.   rosuvastatin 10 MG tablet Commonly known as: CRESTOR Take 10 mg by mouth at bedtime.   spironolactone 25 MG tablet Commonly known as: ALDACTONE Take 12.5 mg by mouth daily.    VITAMIN C PO Take 1 tablet by mouth daily.       Review of Systems   General is not complain of any fever or chills.  Skin does not complain of rashes itching or diaphoresis.  Head ears eyes nose mouth and throat does not complain of visual changes or sore throat.  Respiratory does not complain of being short of breath or having a cough.  Cardiac does not complain of chest pain does have lower extremity edema more noticeable on his surgical side.  GI is not complaining of abdominal pain nausea vomiting diarrhea constipation.  GU is not complaining of dysuria.  Musculoskeletal is status post right hip fracture with repair is not complaining of pain at this time.  Neurologic does not complain of dizziness headache numbness or syncope.  And psych is not complaining of being depressed or anxious he does continue on Lexapro.    Immunization History  Administered Date(s) Administered  . Influenza, High Dose Seasonal PF 09/02/2014, 09/02/2014, 06/28/2015, 06/28/2015  . Influenza, Seasonal, Injecte, Preservative Fre 09/15/2013  . Influenza,inj,Quad PF,6+ Mos 08/15/2016  . Influenza-Unspecified 09/15/2013, 06/29/2015, 06/29/2015, 08/15/2016, 07/18/2017, 07/23/2018  . Moderna SARS-COVID-2 Vaccination 12/06/2019, 01/02/2020  . Pneumococcal Conjugate-13 08/15/2016, 08/15/2016  . Pneumococcal Polysaccharide-23 06/28/2015, 06/28/2015  . Tdap 05/23/2014, 05/23/2014   Pertinent  Health Maintenance Due  Topic Date Due  . COLONOSCOPY  Never done  . INFLUENZA VACCINE  05/28/2020  . PNA vac Low Risk Adult  Completed   No flowsheet data found. Functional Status Survey:    Vitals:   03/22/20 1555  BP: 110/65  Pulse: 78  Resp: 18  Temp: 98 F (36.7 C)  TempSrc: Oral  SpO2: 96%  Weight: 236 lb 6.4 oz (107.2 kg)  Height: 5\' 10"  (1.778 m)   Body mass index is 33.92 kg/m. Physical Exam   In general this is a very pleasant elderly male in no distress.  His skin is warm and  dry surgical site right hip is currently covered.  Eyes visual acuity appears to be intact sclera and conjunctive are clear.  Oropharynx is clear mucous membranes moist he has numerous extractions.  Chest is clear to auscultation there is no labored breathing.  Heart is irregular irregular rate and rhythm he has approximately 2+ edema of his right lower leg trace edema of his left-pedal pulse appears reduced on the right I suspect secondary to the edema edema is cool nonerythematous.  Abdomen is soft obese nontender with positive bowel sounds.  Musculoskeletal is able to move all extremities x4 with limitations of his right lower extremity-  Neurologic appears grossly intact his speech is clear could not appreciate lateralizing findings.  Psych he is alert and oriented pleasant and appropriate  Labs reviewed: Recent Labs    03/15/20 1046 03/16/20 0516 03/17/20 0501 03/18/20 0454 03/19/20 0746  NA  --    < > 138 137 140  K  --    < >  5.1 4.5 3.9  CL  --    < > 104 105 104  CO2  --    < > 30 24 29   GLUCOSE  --    < > 162* 139* 100*  BUN  --    < > 35* 35* 30*  CREATININE  --    < > 1.19 0.87 0.82  CALCIUM  --    < > 8.1* 8.2* 8.3*  MG 2.2  --   --   --   --    < > = values in this interval not displayed.   Recent Labs    03/15/20 0926 03/16/20 0516  AST 33 24  ALT 28 27  ALKPHOS 78 74  BILITOT 1.2 1.7*  PROT 6.6 6.2*  ALBUMIN 4.0 3.7   Recent Labs    03/15/20 0926 03/16/20 0516 03/17/20 0501 03/18/20 0454 03/19/20 0746  WBC 9.2   < > 17.5* 18.8* 13.2*  NEUTROABS 7.7  --   --   --   --   HGB 16.2   < > 13.0 12.0* 12.3*  HCT 49.2   < > 40.6 37.7* 38.5*  MCV 90.6   < > 94.4 93.5 93.2  PLT 191   < > 150 150 196   < > = values in this interval not displayed.   No results found for: TSH No results found for: HGBA1C No results found for: CHOL, HDL, LDLCALC, LDLDIRECT, TRIG, CHOLHDL  Significant Diagnostic Results in last 30 days:  Pelvis Portable  Result  Date: 03/16/2020 CLINICAL DATA:  Right hip replacement. EXAM: PORTABLE PELVIS 1-2 VIEWS COMPARISON:  Intraoperative images of earlier today. FINDINGS: Right hip arthroplasty. No acute hardware complication. Left hip located. Vascular calcifications. IMPRESSION: Expected appearance after right hip arthroplasty. Electronically Signed   By: Abigail Miyamoto M.D.   On: 03/16/2020 17:53   DG Chest Port 1 View  Result Date: 03/15/2020 CLINICAL DATA:  Recent fall with hip fracture, initial encounter EXAM: PORTABLE CHEST 1 VIEW COMPARISON:  04/28/2015 FINDINGS: Cardiac shadow is enlarged but stable. Aortic calcifications are again seen. The lungs are hyperinflated consistent with COPD but stable. No infiltrate is seen. No bony abnormality is noted. IMPRESSION: No acute abnormality noted. Electronically Signed   By: Inez Catalina M.D.   On: 03/15/2020 10:33   DG Knee Right Port  Result Date: 03/15/2020 CLINICAL DATA:  Acute RIGHT femoral neck fracture. RIGHT knee pain. EXAM: PORTABLE RIGHT KNEE - 1-2 VIEW COMPARISON:  None. FINDINGS: No evidence of acute fracture or dislocation. Well-preserved joint spaces for patient age. No visible joint effusion. Femoropopliteal and tibioperoneal artery atherosclerosis. IMPRESSION: No acute osseous abnormality. Well-preserved joint spaces. Electronically Signed   By: Evangeline Dakin M.D.   On: 03/15/2020 16:08   DG C-Arm 1-60 Min-No Report  Result Date: 03/16/2020 Fluoroscopy was utilized by the requesting physician.  No radiographic interpretation.   DG HIP OPERATIVE UNILAT W OR W/O PELVIS RIGHT  Result Date: 03/16/2020 CLINICAL DATA:  Elective surgery right hip. EXAM: OPERATIVE RIGHT HIP (WITH PELVIS IF PERFORMED) 5 VIEWS TECHNIQUE: Fluoroscopic spot image(s) were submitted for interpretation post-operatively. COMPARISON:  03/15/2020 FINDINGS: Several intraoperative fluoroscopic images over the right hip demonstrate placement of a right hip arthroplasty which is intact and  normally located. Recommend correlation with findings at the time of the procedure. IMPRESSION: Expected changes post right hip arthroplasty. Electronically Signed   By: Marin Olp M.D.   On: 03/16/2020 16:25   DG Hip Unilat W or  Wo Pelvis 2-3 Views Right  Result Date: 03/15/2020 CLINICAL DATA:  Pain following fall EXAM: DG HIP (WITH OR WITHOUT PELVIS) 2-3V RIGHT COMPARISON:  None. FINDINGS: Frontal pelvis as well as frontal and lateral right hip images were obtained. There is a comminuted fracture of the subcapital femoral neck region with varus angulation at the fracture site. There is no other fracture appreciable. No dislocation. There is moderate symmetric narrowing of each hip joint. No erosive change. IMPRESSION: Comminuted subcapital femoral neck fracture on the right with varus angulation at the fracture site. No other fracture. No dislocation. Symmetric narrowing each hip joint. Electronically Signed   By: Bretta Bang III M.D.   On: 03/15/2020 09:52    Assessment/Plan  #1 history of right femoral neck fracture status post repair-he appears to have done well he will need PT and OT he does have prophylaxis with Eliquis-and will need follow-up by orthopedics. Out of caution we will order a venous Doppler of the right leg secondary to increasing edema although I suspect this is probably postop edema  2.  Hypertension he continues on atenolol 25 mg a day his Norvasc and irbesartan have been held secondary to soft blood pressures in the hospital at this point will monitor r recent blood pressures 110/65-113/68.  He also continues on Aldactone 12.5 mg a day and Lasix 40 mg a day.  3.  History of atrial fibrillation this appears rate controlled on atenolol 25 mg a day he is on Eliquis 5 mg twice daily for anticoagulation.  4.  History of COPD this appears stable at this point is not complaining of shortness of breath or increased cough does have orders for Advair twice daily and  albuterol inhaler as needed and CPAP.  5.  History of dehydration-will follow with the BMP -this actually appears stable per lab done on 23 May with a BUN of 30 creatinine of 0.82 sodium was 140 will monitor:  #6-history of depression this appears stable on Lexapro 10 mg a day.  7.-History of hyperlipidemia-not stated as uncontrolled continues on Crestor 10 mg a day.  8.-Leukocytosis this was thought to be reactive we will have this updated he does not complain of fever chills increased cough congestion or dysuria.  9.  Edema-as noted above will order venous Doppler of the right leg-I do note he is on Lasix and Aldactone which would indicate possibly some previous history of edema-at this point will monitor--continue to monitor weights for any significant weight gain  [id number]-of note greater than 40 minutes spent assessing patient-reviewing his chart and labs-and coordinating and formulating a plan of care for numerous diagnoses-of note greater than 50% of time spent coordinating plan of care with input as noted above

## 2020-03-23 ENCOUNTER — Other Ambulatory Visit: Payer: Self-pay | Admitting: Internal Medicine

## 2020-03-23 LAB — BASIC METABOLIC PANEL
BUN: 18 (ref 4–21)
CO2: 23 — AB (ref 13–22)
Chloride: 103 (ref 99–108)
Creatinine: 0.7 (ref 0.6–1.3)
Glucose: 101
Potassium: 4.3 (ref 3.4–5.3)
Sodium: 138 (ref 137–147)

## 2020-03-23 LAB — CBC AND DIFFERENTIAL
HCT: 36 — AB (ref 41–53)
Hemoglobin: 12.3 — AB (ref 13.5–17.5)
Neutrophils Absolute: 9
Platelets: 227 (ref 150–399)
WBC: 10.9

## 2020-03-23 LAB — COMPREHENSIVE METABOLIC PANEL
Albumin: 3.1 — AB (ref 3.5–5.0)
Calcium: 8.3 — AB (ref 8.7–10.7)
GFR calc non Af Amer: 90
Globulin: 1.9

## 2020-03-23 LAB — HEPATIC FUNCTION PANEL
ALT: 54 — AB (ref 10–40)
AST: 41 — AB (ref 14–40)
Alkaline Phosphatase: 111 (ref 25–125)
Bilirubin, Total: 0.9

## 2020-03-23 LAB — CBC: RBC: 4.1 (ref 3.87–5.11)

## 2020-03-23 MED ORDER — HYDROCODONE-ACETAMINOPHEN 5-325 MG PO TABS: 1.0000 | ORAL_TABLET | Freq: Four times a day (QID) | ORAL | 0 refills | Status: DC | PRN

## 2020-03-24 ENCOUNTER — Encounter: Payer: Self-pay | Admitting: Internal Medicine

## 2020-03-24 ENCOUNTER — Non-Acute Institutional Stay (SKILLED_NURSING_FACILITY): Payer: Medicare Other | Admitting: Internal Medicine

## 2020-03-24 DIAGNOSIS — I10 Essential (primary) hypertension: Secondary | ICD-10-CM

## 2020-03-24 DIAGNOSIS — Z96641 Presence of right artificial hip joint: Secondary | ICD-10-CM

## 2020-03-24 DIAGNOSIS — D6869 Other thrombophilia: Secondary | ICD-10-CM

## 2020-03-24 DIAGNOSIS — W19XXXA Unspecified fall, initial encounter: Secondary | ICD-10-CM | POA: Diagnosis not present

## 2020-03-24 DIAGNOSIS — S72001A Fracture of unspecified part of neck of right femur, initial encounter for closed fracture: Secondary | ICD-10-CM | POA: Diagnosis not present

## 2020-03-24 DIAGNOSIS — I272 Pulmonary hypertension, unspecified: Secondary | ICD-10-CM

## 2020-03-24 DIAGNOSIS — J42 Unspecified chronic bronchitis: Secondary | ICD-10-CM

## 2020-03-24 DIAGNOSIS — I4821 Permanent atrial fibrillation: Secondary | ICD-10-CM

## 2020-03-24 DIAGNOSIS — G4733 Obstructive sleep apnea (adult) (pediatric): Secondary | ICD-10-CM

## 2020-03-24 DIAGNOSIS — F325 Major depressive disorder, single episode, in full remission: Secondary | ICD-10-CM

## 2020-03-24 DIAGNOSIS — S00522A Blister (nonthermal) of oral cavity, initial encounter: Secondary | ICD-10-CM

## 2020-03-24 DIAGNOSIS — I4891 Unspecified atrial fibrillation: Secondary | ICD-10-CM

## 2020-03-24 DIAGNOSIS — R7303 Prediabetes: Secondary | ICD-10-CM

## 2020-03-24 DIAGNOSIS — Y92009 Unspecified place in unspecified non-institutional (private) residence as the place of occurrence of the external cause: Secondary | ICD-10-CM

## 2020-03-24 DIAGNOSIS — I5032 Chronic diastolic (congestive) heart failure: Secondary | ICD-10-CM

## 2020-03-24 NOTE — Progress Notes (Signed)
Patient ID: Ronald Shepherd, male   DOB: [birth year], 74 y.o.   MRN: [id number]  Provider:  Gwenith Spitz. Renato Gails, D.O., C.M.D. Location:  [facility] Room Number: 110P Place of Service:  SNF (31)  PCP: Kermit Balo, DO Patient Care Team: Kermit Balo, DO as PCP - General (Geriatric Medicine)  Extended Emergency Contact Information Primary Emergency Contact: Darra Lis Home Phone: [phone] Relation: Daughter  Code Status: full code Goals of Care: Advanced Directive information Advanced Directives 03/24/2020  Does Patient Have a Medical Advance Directive? Yes  Type of Advance Directive -  Does patient want to make changes to medical advance directive? No - Patient declined  Would patient like information on creating a medical advance directive? -   Chief Complaint  Patient presents with  . New Admit To SNF    New admit to SNF     HPI: Patient is a 74 y.o. male with h/o chronic diastolic chf, chronic bronchitis with pulmonary htn, htn, prediabetes, obesity, OSA on cpap, permanent afib on NOAC seen today for admission to [facility] for rehab s/p hospitalization 5/19-25/21 with right hip fracture.   He sustained a fall while getting out of his recliner 5/19.  He was seen in the ED and determined to have a right femoral neck fracture and ortho was consulted.  He underwent right total hip arthroplasty via anterior approach 03/16/20 by Dr. Linna Caprice.  He was doing well at the hospital and comes here for short-term rehab with PT, OT with goal to return home.  He is already on the eliquis for his afib so this is also now doubling for DVT prophylaxis.  He is to see Dr. Linna Caprice in 2 wks for his orthopedic surgery wound recheck.  During his hospitalization, bp was noted to be soft at times so  reduction in meds was done ( had been on amlodipine/valsartan, lasix, aldactone, atenolol, but amlodipine/valsartan d/cd at the hospital).  He is for f/u bmp in a week.  BP at  admission is great at 125/82.  He also had some leukocytosis at the hospital felt to be reactive as no signs/symptoms of infection were present.  For f/u cbc in a week.  He reports doing great so far with pain control for his hip fx, but left tongue pain and soreness when he chews and especially when he had pineapples at lunch.  Notes this started after his surgery and intubation.  He's been getting chloraseptic spray but it's only lasting 10-16 mins.    He also notes that when he goes to do his therapy, he does get muscle spasms in his right thigh.    Past Medical History:  Diagnosis Date  . COPD (chronic obstructive pulmonary disease) (HCC)   . Hypertension    Past Surgical History:  Procedure Laterality Date  . TOTAL HIP ARTHROPLASTY Right 03/16/2020   Procedure: TOTAL HIP ARTHROPLASTY ANTERIOR APPROACH;  Surgeon: Samson Frederic, MD;  Location: [facility]S;  Service: Orthopedics;  Laterality: Right;    Social History   Socioeconomic History  . Marital status: Widowed    Spouse name: Not on file  . Number of children: Not on file  . Years of education: Not on file  . Highest education level: Not on file  Occupational History  . Not on file  Tobacco Use  . Smoking status: Former Smoker    Packs/day: 1.00    Years: 20.00    Pack years: 20.00    Types:  Cigarettes    Quit date: 02/14/2015    Years since quitting: 5.1  . Smokeless tobacco: Never Used  Substance and Sexual Activity  . Alcohol use: Not Currently  . Drug use: Never  . Sexual activity: Not on file  Other Topics Concern  . Not on file  Social History Narrative  . Not on file   Social Determinants of Health   Financial Resource Strain:   . Difficulty of Paying Living Expenses:   Food Insecurity:   . Worried About Programme researcher, broadcasting/film/videounning Out of Food in the Last Year:   . Baristaan Out of Food in the Last Year:   Transportation Needs:   . Freight forwarderLack of Transportation (Medical):   Marland Kitchen. Lack of Transportation (Non-Medical):   Physical  Activity:   . Days of Exercise per Week:   . Minutes of Exercise per Session:   Stress:   . Feeling of Stress :   Social Connections:   . Frequency of Communication with Friends and Family:   . Frequency of Social Gatherings with Friends and Family:   . Attends Religious Services:   . Active Member of Clubs or Organizations:   . Attends BankerClub or Organization Meetings:   Marland Kitchen. Marital Status:     reports that he quit smoking about 5 years ago. His smoking use included cigarettes. He has a 20.00 pack-year smoking history. He has never used smokeless tobacco. He reports previous alcohol use. He reports that he does not use drugs.  Functional Status Survey:    History reviewed. No pertinent family history.  Health Maintenance  Topic Date Due  . Hepatitis C Screening  Never done  . INFLUENZA VACCINE  05/28/2020  . TETANUS/TDAP  05/23/2024  . COVID-19 Vaccine  Completed  . PNA vac Low Risk Adult  Completed    No Known Allergies  Outpatient Encounter Medications as of 03/24/2020  Medication Sig  . albuterol (VENTOLIN HFA) 108 (90 Base) MCG/ACT inhaler Inhale 2 puffs into the lungs every 4 (four) hours as needed for shortness of breath.  . Ascorbic Acid (VITAMIN C PO) Take 1 tablet by mouth daily.  Marland Kitchen. atenolol (TENORMIN) 25 MG tablet Take 25 mg by mouth daily.  Marland Kitchen. b complex vitamins capsule Take 1 capsule by mouth daily.  . Cholecalciferol 25 MCG (1000 UT) capsule Take 1,000 Units by mouth daily.  Marland Kitchen. ELIQUIS 5 MG TABS tablet Take 5 mg by mouth 2 (two) times daily.  Marland Kitchen. escitalopram (LEXAPRO) 10 MG tablet Take 10 mg by mouth daily.  . Fluticasone-Salmeterol (ADVAIR) 250-50 MCG/DOSE AEPB Inhale 1 puff into the lungs 2 (two) times daily.  . furosemide (LASIX) 40 MG tablet Take 40 mg by mouth daily.  Marland Kitchen. HYDROcodone-acetaminophen (NORCO/VICODIN) 5-325 MG tablet Take 1 tablet by mouth every 6 (six) hours as needed for moderate pain or severe pain.  . Multiple Vitamins-Minerals (CENTRUM PO) Take 1  tablet by mouth daily.  . potassium chloride SA (KLOR-CON) 20 MEQ tablet Take 20 mEq by mouth 2 (two) times daily.  . rosuvastatin (CRESTOR) 10 MG tablet Take 10 mg by mouth at bedtime.  Marland Kitchen. spironolactone (ALDACTONE) 25 MG tablet Take 12.5 mg by mouth daily.   No facility-administered encounter medications on file as of 03/24/2020.    Review of Systems  Constitutional: Negative for chills, fever and malaise/fatigue.  HENT: Positive for sore throat. Negative for congestion.        Sore tongue  Eyes: Negative for blurred vision.  Respiratory: Negative for cough and shortness of  breath.   Cardiovascular: Negative for chest pain, palpitations and leg swelling.  Gastrointestinal: Negative for abdominal pain, blood in stool, constipation, diarrhea and melena.  Genitourinary: Negative for dysuria.  Musculoskeletal: Positive for falls, joint pain and myalgias.       Getting spasms in right thigh  Skin: Negative for itching and rash.  Neurological: Negative for dizziness and loss of consciousness.  Psychiatric/Behavioral: Negative for depression and memory loss. The patient is not nervous/anxious and does not have insomnia.     Vitals:   03/24/20 0850  BP: 125/82  Pulse: 95  Temp: (!) 97.5 F (36.4 C)  Weight: 236 lb 6.4 oz (107.2 kg)  Height: 5\' 10"  (1.778 m)   Body mass index is 33.92 kg/m. Physical Exam Constitutional:      Appearance: Normal appearance. He is obese.  HENT:     Head: Normocephalic and atraumatic.     Right Ear: External ear normal.     Left Ear: External ear normal.     Nose: Nose normal. No congestion.     Mouth/Throat:     Pharynx: No oropharyngeal exudate.     Comments: Abrasion left posterior tongue; erythema of left side of throat  Eyes:     Extraocular Movements: Extraocular movements intact.     Conjunctiva/sclera: Conjunctivae normal.     Pupils: Pupils are equal, round, and reactive to light.  Cardiovascular:     Rate and Rhythm: Rhythm irregular.       Heart sounds: No murmur.  Pulmonary:     Effort: Pulmonary effort is normal.     Breath sounds: Normal breath sounds. No wheezing, rhonchi or rales.  Abdominal:     General: Bowel sounds are normal. There is no distension.     Palpations: Abdomen is soft.     Tenderness: There is no abdominal tenderness. There is no guarding or rebound.  Musculoskeletal:     Cervical back: Neck supple.     Comments: See neuro  Skin:    General: Skin is warm and dry.  Neurological:     General: No focal deficit present.     Mental Status: He is alert and oriented to person, place, and time.     Cranial Nerves: No cranial nerve deficit.     Sensory: No sensory deficit.     Motor: No weakness.     Coordination: Coordination normal.     Deep Tendon Reflexes: Reflexes normal.     Comments: Right hip incision with dressing intact, no surrounding erythema or drainage, some ecchymoses of area present and general swelling  Psychiatric:        Mood and Affect: Mood normal.        Behavior: Behavior normal.        Thought Content: Thought content normal.        Judgment: Judgment normal.     Labs reviewed: Basic Metabolic Panel: Recent Labs    03/15/20 1046 03/16/20 0516 03/17/20 0501 03/17/20 0501 03/18/20 0454 03/19/20 0746 03/23/20 0000  NA  --    < > 138   < > 137 140 138  K  --    < > 5.1   < > 4.5 3.9 4.3  CL  --    < > 104   < > 105 104 103  CO2  --    < > 30   < > 24 29 23*  GLUCOSE  --    < > 162*  --  139* 100*  --  BUN  --    < > 35*   < > 35* 30* 18  CREATININE  --    < > 1.19   < > 0.87 0.82 0.7  CALCIUM  --    < > 8.1*   < > 8.2* 8.3* 8.3*  MG 2.2  --   --   --   --   --   --    < > = values in this interval not displayed.   Liver Function Tests: Recent Labs    03/15/20 0926 03/16/20 0516 03/23/20 0000  AST 33 24 41*  ALT 28 27 54*  ALKPHOS 78 74 111  BILITOT 1.2 1.7*  --   PROT 6.6 6.2*  --   ALBUMIN 4.0 3.7 3.1*   No results for input(s): LIPASE, AMYLASE in  the last 8760 hours. No results for input(s): AMMONIA in the last 8760 hours. CBC: Recent Labs    03/15/20 0926 03/16/20 0516 03/17/20 0501 03/17/20 0501 03/18/20 0454 03/19/20 0746 03/23/20 0000  WBC 9.2   < > 17.5*   < > 18.8* 13.2* 10.9  NEUTROABS 7.7  --   --   --   --   --  9  HGB 16.2   < > 13.0   < > 12.0* 12.3* 12.3*  HCT 49.2   < > 40.6   < > 37.7* 38.5* 36*  MCV 90.6   < > 94.4  --  93.5 93.2  --   PLT 191   < > 150   < > 150 196 227   < > = values in this interval not displayed.   Cardiac Enzymes: No results for input(s): CKTOTAL, CKMB, CKMBINDEX, TROPONINI in the last 8760 hours. BNP: Invalid input(s): POCBNP No results found for: HGBA1C No results found for: TSH No results found for: VITAMINB12 No results found for: FOLATE No results found for: IRON, TIBC, FERRITIN  Imaging and Procedures obtained prior to SNF admission: Pelvis Portable  Result Date: 03/16/2020 CLINICAL DATA:  Right hip replacement. EXAM: PORTABLE PELVIS 1-2 VIEWS COMPARISON:  Intraoperative images of earlier today. FINDINGS: Right hip arthroplasty. No acute hardware complication. Left hip located. Vascular calcifications. IMPRESSION: Expected appearance after right hip arthroplasty. Electronically Signed   By: Jeronimo Greaves M.D.   On: 03/16/2020 17:53   DG Chest Port 1 View  Result Date: 03/15/2020 CLINICAL DATA:  Recent fall with hip fracture, initial encounter EXAM: PORTABLE CHEST 1 VIEW COMPARISON:  04/28/2015 FINDINGS: Cardiac shadow is enlarged but stable. Aortic calcifications are again seen. The lungs are hyperinflated consistent with COPD but stable. No infiltrate is seen. No bony abnormality is noted. IMPRESSION: No acute abnormality noted. Electronically Signed   By: Alcide Clever M.D.   On: 03/15/2020 10:33   DG Knee Right Port  Result Date: 03/15/2020 CLINICAL DATA:  Acute RIGHT femoral neck fracture. RIGHT knee pain. EXAM: PORTABLE RIGHT KNEE - 1-2 VIEW COMPARISON:  None. FINDINGS: No  evidence of acute fracture or dislocation. Well-preserved joint spaces for patient age. No visible joint effusion. Femoropopliteal and tibioperoneal artery atherosclerosis. IMPRESSION: No acute osseous abnormality. Well-preserved joint spaces. Electronically Signed   By: Hulan Saas M.D.   On: 03/15/2020 16:08   DG C-Arm 1-60 Min-No Report  Result Date: 03/16/2020 Fluoroscopy was utilized by the requesting physician.  No radiographic interpretation.   DG HIP OPERATIVE UNILAT W OR W/O PELVIS RIGHT  Result Date: 03/16/2020 CLINICAL DATA:  Elective surgery right hip. EXAM: OPERATIVE RIGHT HIP (  WITH PELVIS IF PERFORMED) 5 VIEWS TECHNIQUE: Fluoroscopic spot image(s) were submitted for interpretation post-operatively. COMPARISON:  03/15/2020 FINDINGS: Several intraoperative fluoroscopic images over the right hip demonstrate placement of a right hip arthroplasty which is intact and normally located. Recommend correlation with findings at the time of the procedure. IMPRESSION: Expected changes post right hip arthroplasty. Electronically Signed   By: Marin Olp M.D.   On: 03/16/2020 16:25   DG Hip Unilat W or Wo Pelvis 2-3 Views Right  Result Date: 03/15/2020 CLINICAL DATA:  Pain following fall EXAM: DG HIP (WITH OR WITHOUT PELVIS) 2-3V RIGHT COMPARISON:  None. FINDINGS: Frontal pelvis as well as frontal and lateral right hip images were obtained. There is a comminuted fracture of the subcapital femoral neck region with varus angulation at the fracture site. There is no other fracture appreciable. No dislocation. There is moderate symmetric narrowing of each hip joint. No erosive change. IMPRESSION: Comminuted subcapital femoral neck fracture on the right with varus angulation at the fracture site. No other fracture. No dislocation. Symmetric narrowing each hip joint. Electronically Signed   By: Lowella Grip III M.D.   On: 03/15/2020 09:52    Assessment/Plan 1. Closed fracture of right hip,  initial encounter [facility]) -s/p right total hip arthroplasty anterior with Dr. Lyla Glassing, here for rehab and doing well thus far -cont PT, OT with goal of returning home  2. Status post total replacement of right hip -cont prn norco but assess use when seen next to see if this can be reduced or stopped and tylenol used -Add robaxin 500mg  po bid prn muscle spasms -cont chronic eliquis DVT prophylaxis -cont pt, ot -f/u in 2 wks with orthopedics for wound check, f/u xrays if needed  3. Fall at home, initial encounter -resulted in fx -cont therapy here and walker use  4. Permanent atrial fibrillation (HCC) -continue his home atenolol and eliquis  5. Hypercoagulable state due to atrial fibrillation (HCC) -cont eliquis, f/u cbc and bmp were ok  6. Essential hypertension -bp great w/o amlodipine/valsartan today, monitor  7. Chronic bronchitis, unspecified chronic bronchitis type ([place]) -cont his home regimen with advair and prn albuterol hfa  8. Pulmonary hypertension ([place]) -due to his chronic bronchitis/prior tobacco use  9. Major depression in remission [facility]) -doing well on lexapro therapy--was in good spirits during visit  10. Chronic diastolic congestive heart failure (HCC) -cont his lasix and aldactone -noted some elevated transaminases on lab yesterday--? Any fatty liver/portal htn or right sided chf with his pulmonary htn contributing -cont diuretic regimen and beta blocker  11. Obstructive sleep apnea (adult) (pediatric) -cont CPAP at home settings qhs, off additional supplemental oxygen  12. Prediabetes -hba1c 5.9 11/25/19 in [facility] system  13. Traumatic blister of mouth, initial encounter Viscous lidocaine mouthwash  Family/ staff Communication: discussed with PT in room and ADON  Labs/tests ordered:  No new added today  Audie Wieser L. Ahlivia Salahuddin, D.O. [facility] [address], [place] [address] Cell Phone (Mon-Fri 8am-5pm):   [phone] On Call:  [phone] & follow prompts after 5pm & weekends Office Phone:  [phone] Office Fax:  [phone]

## 2020-04-03 ENCOUNTER — Non-Acute Institutional Stay (SKILLED_NURSING_FACILITY): Payer: Medicare Other | Admitting: Internal Medicine

## 2020-04-03 ENCOUNTER — Encounter: Payer: Self-pay | Admitting: Internal Medicine

## 2020-04-03 DIAGNOSIS — I10 Essential (primary) hypertension: Secondary | ICD-10-CM | POA: Diagnosis not present

## 2020-04-03 DIAGNOSIS — S72001A Fracture of unspecified part of neck of right femur, initial encounter for closed fracture: Secondary | ICD-10-CM | POA: Diagnosis not present

## 2020-04-03 DIAGNOSIS — I4821 Permanent atrial fibrillation: Secondary | ICD-10-CM | POA: Diagnosis not present

## 2020-04-03 DIAGNOSIS — I5032 Chronic diastolic (congestive) heart failure: Secondary | ICD-10-CM

## 2020-04-03 DIAGNOSIS — Z8709 Personal history of other diseases of the respiratory system: Secondary | ICD-10-CM

## 2020-04-03 NOTE — Progress Notes (Signed)
Location:  [facility] Room Number: 110-P Place of Service:  SNF [phone])  Provider: Edmon Crape, PA-C  PCP: Kermit Balo, DO Patient Care Team: Kermit Balo, DO as PCP - General (Geriatric Medicine)  Extended Emergency Contact Information Primary Emergency Contact: Darra Lis Home Phone: [phone] Relation: Daughter  Code Status: Full Code Goals of care:  Advanced Directive information Advanced Directives 03/24/2020  Does Patient Have a Medical Advance Directive? Yes  Type of Advance Directive -  Does patient want to make changes to medical advance directive? No - Patient declined  Would patient like information on creating a medical advance directive? -     No Known Allergies  Chief Complaint  Patient presents with   Discharge Note    Patient is seen for discharge from SNF    HPI:  74 y.o. male seen today for discharge from facility-tentatively scheduled for today.  Patient has been here for short-term therapy with a history of a right hip fracture that was surgically repaired-she has worked with PT and OT-he and his family feel he would benefit from additional therapy but it seems the insurance company will not reimburse after today.  They are appealing the decision.  Currently has no complaints other than he feels he would benefit from more therapy.  His pain at this point appears to be controlled-he is no longer requiring narcotics.  He is on Eliquis for anticoagulation for DVT prophylaxis-he is also on Eliquis for history of atrial fibrillation.  He does receive Robaxin every 12 hours as needed.  In regards to other medical issues this appears stable he recently complained of a mouth  blister but he says this is feeling better he has been prescribed viscous lidocaine and apparently this is helping.  Regards to atrial fibrillation this appears rate controlled on atenolol 25 mg a day he continues again on Eliquis for  anticoagulation.  He also has a history of hypertension Norvasc and valsartan were discontinued in the hospital secondary to soft blood pressures but he appears to be stable on the current dose of atenolol blood pressure today is 119/78.  He also has a history of chronic bronchitis and continues on Advair as well as as needed albuterol.  He also has a history of depression but this has not been an issue during his stay here he continues to be bright alert in good spirits he is on Lexapro 10 mg a day.  Regards diastolic CHF he continues on Lasix 40 mg a day and spironolactone 12.5 mg a day with potassium supplementation 20 mEq twice daily-his edema actually has improved significantly he is not complaining of any chest pain or shortness of breath.  He also has a history of hyperlipidemia since his stay here was short was not aggressive pursuing a lipid panel he is on Crestor 10 mg a day.      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 His smoking use included cigarettes. He has a 20.00 pack-year smoking history. He has never used smokeless tobacco. He reports previous alcohol use. He reports that he does  not use drugs. Social History   Socioeconomic History   Marital status: Widowed    Spouse name: Not on file   Number of children: Not on file   Years of education: Not on file   Highest education level: Not on file  Occupational History   Not on file  Tobacco Use   Smoking status: Former Smoker    Packs/day: 1.00    Years: 20.00    Pack years: 20.00    Types: Cigarettes    Quit date: 02/14/2015    Years since  quitting: 5.1   Smokeless tobacco: Never Used  Substance and Sexual Activity   Alcohol use: Not Currently   Drug use: Never   Sexual activity: Not on file  Other Topics Concern   Not on file  Social History Narrative   Not on file   Social Determinants of Health   Financial Resource Strain:    Difficulty of Paying Living Expenses:   Food Insecurity:    Worried About Charity fundraiser in the Last Year:    Arboriculturist in the Last Year:   Transportation Needs:    Film/video editor (Medical):    Lack of Transportation (Non-Medical):   Physical Activity:    Days of Exercise per Week:    Minutes of Exercise per Session:   Stress:    Feeling of Stress :   Social Connections:    Frequency of Communication with Friends and Family:    Frequency of Social Gatherings with Friends and Family:    Attends Religious Services:    Active Member of Clubs or Organizations:    Attends Music therapist:    Marital Status:   Intimate Partner Violence:    Fear of Current or Ex-Partner:    Emotionally Abused:    Physically Abused:    Sexually Abused:    Functional Status Survey:    No Known Allergies  Pertinent  Health Maintenance Due  Topic Date Due   INFLUENZA VACCINE  05/28/2020   PNA vac Low Risk Adult  Completed    Medications: Outpatient Encounter Medications as of 04/03/2020  Medication Sig   albuterol (VENTOLIN HFA) 108 (90 Base) MCG/ACT inhaler Inhale 2 puffs into the lungs every 4 (four) hours as needed for shortness of breath.   ascorbic acid (VITAMIN C) 500 MG tablet Take 500 mg by mouth daily.   atenolol (TENORMIN) 25 MG tablet Take 25 mg by mouth daily.   b complex vitamins capsule Take 1 capsule by mouth daily.   Cholecalciferol 25 MCG (1000 UT) capsule Take 1,000 Units by mouth daily.   ELIQUIS 5 MG TABS tablet Take 5 mg by mouth 2 (two) times daily.   escitalopram (LEXAPRO) 10 MG tablet Take 10 mg by mouth  daily.   Fluticasone-Salmeterol (ADVAIR) 250-50 MCG/DOSE AEPB Inhale 1 puff into the lungs 2 (two) times daily.   furosemide (LASIX) 40 MG tablet Take 40 mg by mouth daily.   methocarbamol (ROBAXIN) 500 MG tablet Take 500 mg by mouth every 12 (twelve) hours as needed for muscle spasms.   Multiple Vitamins-Minerals (CENTRUM PO) Take 1 tablet by mouth daily.   phenol (CHLORASEPTIC) 1.4 % LIQD Use as directed 2 sprays in the mouth or throat 4 (four) times daily as needed for throat irritation / pain.   potassium chloride SA (KLOR-CON) 20 MEQ tablet Take 20 mEq by mouth 2 (two) times daily.   rosuvastatin (CRESTOR) 10 MG tablet Take 10 mg  by mouth at bedtime.   spironolactone (ALDACTONE) 25 MG tablet Take 12.5 mg by mouth daily.   [DISCONTINUED] Ascorbic Acid (VITAMIN C PO) Take 1 tablet by mouth daily.   [DISCONTINUED] HYDROcodone-acetaminophen (NORCO/VICODIN) 5-325 MG tablet Take 1 tablet by mouth every 6 (six) hours as needed for moderate pain or severe pain.   No facility-administered encounter medications on file as of 04/03/2020.    Review of Systems In general he is not complaining of any fever or chills.  Skin does not complain of rashes itching or diaphoresis.  Head ears eyes nose mouth and throat does not complain of visual changes or sore throat.  Respiratory does not complain of being short of breath or having a cough.  Cardiac does not complain of chest pain his edema appears to be improved compared to his admission presentation.  GI is not complaining of abdominal pain nausea vomiting diarrhea constipation.  GU does not complain of dysuria.  Musculoskeletal at this point her pain appears to be controlled he feels he still weak and really needs some more inpatient therapy.  Neurologic does have weakness does not complain of dizziness headache or syncope.  And psych does not complain of being depressed or anxious I do note he is on  Lexapro.            Vitals:   04/03/20 0930  BP: 119/78  Pulse: 87  Resp: 18  Temp: 98 F (36.7 C)  TempSrc: Oral  Weight: 237 lb 3.2 oz (107.6 kg)  Height: 5\' 10"  (1.778 m)   Body mass index is 34.03 kg/m. Physical Exam In general this is a pleasant elderly male in no distress.  His skin is warm and dry there is covering over his right hip surgical site.  Eyes visual acuity appears to be intact sclera and conjunctive are clear.  Oropharynx clear mucous membranes moist.  Chest is clear to auscultation there is no labored breathing.  Heart is irregular irregular rate and rhythm he has trace lower extremity edema this is significantly improved compared to prior exam.  Abdomen is obese soft nontender with positive bowel sounds.  Musculoskeletal is able to move all extremities x4-is able to stand without assistance and ambulate but continues to be weak.  Neurologic cannot really appreciate lateralizing findings his speech is clear cranial nerves appear to be intact.  Psych he is alert and oriented pleasant and appropriate Labs reviewed: Basic Metabolic Panel: Recent Labs    03/15/20 1046 03/16/20 0516 03/17/20 0501 03/17/20 0501 03/18/20 0454 03/19/20 0746 03/23/20 0000  NA  --    < > 138   < > 137 140 138  K  --    < > 5.1   < > 4.5 3.9 4.3  CL  --    < > 104   < > 105 104 103  CO2  --    < > 30   < > 24 29 23*  GLUCOSE  --    < > 162*  --  139* 100*  --   BUN  --    < > 35*   < > 35* 30* 18  CREATININE  --    < > 1.19   < > 0.87 0.82 0.7  CALCIUM  --    < > 8.1*   < > 8.2* 8.3* 8.3*  MG 2.2  --   --   --   --   --   --    < > =  values in this interval not displayed.   Liver Function Tests: Recent Labs    03/15/20 0926 03/16/20 0516 03/23/20 0000  AST 33 24 41*  ALT 28 27 54*  ALKPHOS 78 74 111  BILITOT 1.2 1.7*  --   PROT 6.6 6.2*  --   ALBUMIN 4.0 3.7 3.1*   No results for input(s): LIPASE, AMYLASE in the last 8760 hours. No results for  input(s): AMMONIA in the last 8760 hours. CBC: Recent Labs    03/15/20 0926 03/16/20 0516 03/17/20 0501 03/17/20 0501 03/18/20 0454 03/19/20 0746 03/23/20 0000  WBC 9.2   < > 17.5*   < > 18.8* 13.2* 10.9  NEUTROABS 7.7  --   --   --   --   --  9  HGB 16.2   < > 13.0   < > 12.0* 12.3* 12.3*  HCT 49.2   < > 40.6   < > 37.7* 38.5* 36*  MCV 90.6   < > 94.4  --  93.5 93.2  --   PLT 191   < > 150   < > 150 196 227   < > = values in this interval not displayed.   Cardiac Enzymes: No results for input(s): CKTOTAL, CKMB, CKMBINDEX, TROPONINI in the last 8760 hours. BNP: Invalid input(s): POCBNP CBG: No results for input(s): GLUCAP in the last 8760 hours.  Procedures and Imaging Studies During Stay: Pelvis Portable  Result Date: 03/16/2020 CLINICAL DATA:  Right hip replacement. EXAM: PORTABLE PELVIS 1-2 VIEWS COMPARISON:  Intraoperative images of earlier today. FINDINGS: Right hip arthroplasty. No acute hardware complication. Left hip located. Vascular calcifications. IMPRESSION: Expected appearance after right hip arthroplasty. Electronically Signed   By: Jeronimo GreavesKyle  Talbot M.D.   On: 03/16/2020 17:53   DG Chest Port 1 View  Result Date: 03/15/2020 CLINICAL DATA:  Recent fall with hip fracture, initial encounter EXAM: PORTABLE CHEST 1 VIEW COMPARISON:  04/28/2015 FINDINGS: Cardiac shadow is enlarged but stable. Aortic calcifications are again seen. The lungs are hyperinflated consistent with COPD but stable. No infiltrate is seen. No bony abnormality is noted. IMPRESSION: No acute abnormality noted. Electronically Signed   By: Alcide CleverMark  Lukens M.D.   On: 03/15/2020 10:33   DG Knee Right Port  Result Date: 03/15/2020 CLINICAL DATA:  Acute RIGHT femoral neck fracture. RIGHT knee pain. EXAM: PORTABLE RIGHT KNEE - 1-2 VIEW COMPARISON:  None. FINDINGS: No evidence of acute fracture or dislocation. Well-preserved joint spaces for patient age. No visible joint effusion. Femoropopliteal and tibioperoneal  artery atherosclerosis. IMPRESSION: No acute osseous abnormality. Well-preserved joint spaces. Electronically Signed   By: Hulan Saashomas  Lawrence M.D.   On: 03/15/2020 16:08   DG C-Arm 1-60 Min-No Report  Result Date: 03/16/2020 CLINICAL DATA:  Elective surgery right hip. EXAM: OPERATIVE RIGHT HIP (WITH PELVIS IF PERFORMED) 5 VIEWS TECHNIQUE: Fluoroscopic spot image(s) were submitted for interpretation post-operatively. COMPARISON:  03/15/2020 FINDINGS: Several intraoperative fluoroscopic images over the right hip demonstrate placement of a right hip arthroplasty which is intact and normally located. Recommend correlation with findings at the time of the procedure. IMPRESSION: Expected changes post right hip arthroplasty. Electronically Signed   By: Elberta Fortisaniel  Boyle M.D.   On: 03/16/2020 16:25   DG HIP OPERATIVE UNILAT W OR W/O PELVIS RIGHT  Result Date: 03/16/2020 CLINICAL DATA:  Elective surgery right hip. EXAM: OPERATIVE RIGHT HIP (WITH PELVIS IF PERFORMED) 5 VIEWS TECHNIQUE: Fluoroscopic spot image(s) were submitted for interpretation post-operatively. COMPARISON:  03/15/2020 FINDINGS: Several intraoperative fluoroscopic images  over the right hip demonstrate placement of a right hip arthroplasty which is intact and normally located. Recommend correlation with findings at the time of the procedure. IMPRESSION: Expected changes post right hip arthroplasty. Electronically Signed   By: Elberta Fortis M.D.   On: 03/16/2020 16:25   DG Hip Unilat W or Wo Pelvis 2-3 Views Right  Result Date: 03/15/2020 CLINICAL DATA:  Pain following fall EXAM: DG HIP (WITH OR WITHOUT PELVIS) 2-3V RIGHT COMPARISON:  None. FINDINGS: Frontal pelvis as well as frontal and lateral right hip images were obtained. There is a comminuted fracture of the subcapital femoral neck region with varus angulation at the fracture site. There is no other fracture appreciable. No dislocation. There is moderate symmetric narrowing of each hip joint. No  erosive change. IMPRESSION: Comminuted subcapital femoral neck fracture on the right with varus angulation at the fracture site. No other fracture. No dislocation. Symmetric narrowing each hip joint. Electronically Signed   By: Bretta Bang III M.D.   On: 03/15/2020 09:52    Assessment/Plan:   #1 history of right femoral neck fracture status post repair-she appears to be doing well but has continued weakness-he is appealing his discharge so hopefully he can receive more therapy here in skilled nursing.  This appears to be an insurance issue.  He is followed by orthopedics-he continues on Robaxin as needed at this point pain appears to be controlled-when he goes home he will need continued PT and OT.  He continues on Eliquis for anticoagulation DVT prophylaxis he is also on Eliquis with a history of A. fib.  2.  History of atrial fibrillation this appears rate controlled on atenolol 25 mg a day-he is on Eliquis 5 mg twice daily for anticoagulation.  3.  History of hypertension this appears stable on atenolol Norvasc and irbesartan were discontinued in the hospital because of soft blood pressures.  4.  History of COPD appears stable he does not complain of any increasing cough or shortness of breath-he does have orders for Advair twice a day as well as an albuterol head inhaler as needed-he does have CPAP at night.  5.  History of edema-diastolic CHF--he continues on Lasix 40 mg a day and spironolactone 12.5 mg a day as well as potassium supplementation-his edema appears significantly improved.  Will try to obtain a metabolic panel before discharge to ensure stability of his renal function and electrolytes.  6.-History of hyperlipidemia not stated as uncontrolled he is on Crestor 10 mg a day-since his stay here was quite sure was not aggressive pursuing a lipid panel.  7.  History of leukocytosis this was thought to be reactive in the hospital-this appears to have trended down  significantly-.   #8 depression this appears stable on Lexapro 10 mg a day.  #9 history of mouth blister this appears to have responded favorably to viscous lidocaine.      Regards to DME: Will need a bedside commode as well as rolling walker secondary to continued weakness and fall risk status post surgery for right hip repair  He also will need continued PT and OT:    Patient has been advised to f/u with their PCP in 1-2 weeks to for a transitions of care visit.  Social services at their facility was responsible for arranging this appointment.  Pt was provided with adequate prescriptions of noncontrolled medications to reach the scheduled appointment .  For controlled substances, a limited supply was provided as appropriate for the individual patient.  If  the pt normally receives these medications from a pain clinic or has a contract with another physician, these medications should be received from that clinic or physician only).     [id number] note greater than 30 minutes spent on this discharge summary-greater than 50% of time spent coordinating and formulating a plan of care for numerous diagnoses   Addendum-12/07/2019.  Patient's discharge has been rescheduled for Thursday, June 10-we have done updated  labs on April 04, 2020 shows stability with a hemoglobin of 12.8-white count of 7.8-platelets of 334.  Sodium is 140 potassium 4.4 BUN 19 creatinine 0.8.

## 2020-04-04 ENCOUNTER — Encounter: Payer: Self-pay | Admitting: Internal Medicine

## 2020-04-04 LAB — BASIC METABOLIC PANEL
BUN: 19 (ref 4–21)
CO2: 22 (ref 13–22)
Chloride: 106 (ref 99–108)
Creatinine: 0.8 (ref 0.6–1.3)
Glucose: 96
Potassium: 4.4 (ref 3.4–5.3)
Sodium: 140 (ref 137–147)

## 2020-04-04 LAB — CBC AND DIFFERENTIAL
HCT: 40 — AB (ref 41–53)
Hemoglobin: 12.8 — AB (ref 13.5–17.5)
Platelets: 334 (ref 150–399)
WBC: 7.8

## 2020-04-04 LAB — COMPREHENSIVE METABOLIC PANEL
Calcium: 9.5 (ref 8.7–10.7)
GFR calc Af Amer: >90
GFR calc non Af Amer: 86.76

## 2020-04-04 LAB — CBC: RBC: 4.37 (ref 3.87–5.11)

## 2020-04-05 MED ORDER — METHOCARBAMOL 500 MG PO TABS: 500.0000 mg | ORAL_TABLET | Freq: Two times a day (BID) | ORAL | 0 refills | Status: AC | PRN

## 2020-04-05 MED ORDER — SPIRONOLACTONE 25 MG PO TABS: 12.5000 mg | ORAL_TABLET | Freq: Every day | ORAL | 0 refills | Status: AC

## 2020-04-05 MED ORDER — FLUTICASONE-SALMETEROL 250-50 MCG/DOSE IN AEPB: 1.0000 | INHALATION_SPRAY | Freq: Two times a day (BID) | RESPIRATORY_TRACT | 0 refills | Status: AC

## 2020-04-05 MED ORDER — FUROSEMIDE 40 MG PO TABS: 40.0000 mg | ORAL_TABLET | Freq: Every day | ORAL | 0 refills | Status: AC

## 2020-04-05 MED ORDER — ELIQUIS 5 MG PO TABS: 5.0000 mg | ORAL_TABLET | Freq: Two times a day (BID) | ORAL | 0 refills | Status: AC

## 2020-04-05 MED ORDER — POTASSIUM CHLORIDE CRYS ER 20 MEQ PO TBCR: 20.0000 meq | EXTENDED_RELEASE_TABLET | Freq: Two times a day (BID) | ORAL | 0 refills | Status: DC

## 2020-04-05 MED ORDER — ALBUTEROL SULFATE HFA 108 (90 BASE) MCG/ACT IN AERS: 2.0000 | INHALATION_SPRAY | RESPIRATORY_TRACT | 0 refills | Status: AC | PRN

## 2020-04-05 MED ORDER — ATENOLOL 25 MG PO TABS: 25.0000 mg | ORAL_TABLET | Freq: Every day | ORAL | 0 refills | Status: AC

## 2020-04-05 MED ORDER — ESCITALOPRAM OXALATE 10 MG PO TABS: 10.0000 mg | ORAL_TABLET | Freq: Every day | ORAL | 0 refills | Status: AC

## 2020-04-05 MED ORDER — ROSUVASTATIN CALCIUM 10 MG PO TABS: 10.0000 mg | ORAL_TABLET | Freq: Every day | ORAL | 0 refills | Status: AC

## 2020-11-18 ENCOUNTER — Emergency Department (HOSPITAL_COMMUNITY)
Admission: EM | Admit: 2020-11-18 | Discharge: 2020-11-19 | Disposition: A | Discharge status: Home / Self Care | Payer: Medicare Other | Attending: Emergency Medicine | Admitting: Emergency Medicine

## 2020-11-18 ENCOUNTER — Emergency Department (HOSPITAL_COMMUNITY): Payer: Medicare Other

## 2020-11-18 ENCOUNTER — Encounter (HOSPITAL_COMMUNITY): Payer: Self-pay

## 2020-11-18 ENCOUNTER — Other Ambulatory Visit: Payer: Self-pay

## 2020-11-18 DIAGNOSIS — J449 Chronic obstructive pulmonary disease, unspecified: Secondary | ICD-10-CM | POA: Insufficient documentation

## 2020-11-18 DIAGNOSIS — Z79899 Other long term (current) drug therapy: Secondary | ICD-10-CM | POA: Diagnosis not present

## 2020-11-18 DIAGNOSIS — Z87891 Personal history of nicotine dependence: Secondary | ICD-10-CM | POA: Diagnosis not present

## 2020-11-18 DIAGNOSIS — I872 Venous insufficiency (chronic) (peripheral): Secondary | ICD-10-CM | POA: Diagnosis not present

## 2020-11-18 DIAGNOSIS — Z96641 Presence of right artificial hip joint: Secondary | ICD-10-CM | POA: Insufficient documentation

## 2020-11-18 DIAGNOSIS — R600 Localized edema: Secondary | ICD-10-CM | POA: Insufficient documentation

## 2020-11-18 DIAGNOSIS — R000 Tachycardia, unspecified: Secondary | ICD-10-CM | POA: Insufficient documentation

## 2020-11-18 DIAGNOSIS — I5030 Unspecified diastolic (congestive) heart failure: Secondary | ICD-10-CM | POA: Insufficient documentation

## 2020-11-18 DIAGNOSIS — Z7901 Long term (current) use of anticoagulants: Secondary | ICD-10-CM | POA: Insufficient documentation

## 2020-11-18 DIAGNOSIS — R531 Weakness: Secondary | ICD-10-CM | POA: Diagnosis present

## 2020-11-18 DIAGNOSIS — R509 Fever, unspecified: Secondary | ICD-10-CM | POA: Insufficient documentation

## 2020-11-18 DIAGNOSIS — I110 Hypertensive heart disease with heart failure: Secondary | ICD-10-CM | POA: Insufficient documentation

## 2020-11-18 DIAGNOSIS — Z20822 Contact with and (suspected) exposure to covid-19: Secondary | ICD-10-CM | POA: Diagnosis not present

## 2020-11-18 LAB — URINALYSIS, ROUTINE W REFLEX MICROSCOPIC
Bacteria, UA: NONE SEEN
Bilirubin Urine: NEGATIVE
Glucose, UA: NEGATIVE mg/dL
Hgb urine dipstick: NEGATIVE
Ketones, ur: 20 mg/dL — AB
Nitrite: NEGATIVE
Protein, ur: NEGATIVE mg/dL
Specific Gravity, Urine: 1.011 (ref 1.005–1.030)
pH: 6 (ref 5.0–8.0)

## 2020-11-18 LAB — CBC
HCT: 50.4 % (ref 39.0–52.0)
Hemoglobin: 16.3 g/dL (ref 13.0–17.0)
MCH: 30.2 pg (ref 26.0–34.0)
MCHC: 32.3 g/dL (ref 30.0–36.0)
MCV: 93.3 fL (ref 80.0–100.0)
Platelets: 183 10*3/uL (ref 150–400)
RBC: 5.4 MIL/uL (ref 4.22–5.81)
RDW: 14.8 % (ref 11.5–15.5)
WBC: 17.3 10*3/uL — ABNORMAL HIGH (ref 4.0–10.5)
nRBC: 0 % (ref 0.0–0.2)

## 2020-11-18 LAB — BASIC METABOLIC PANEL
Anion gap: 13 (ref 5–15)
BUN: 19 mg/dL (ref 8–23)
CO2: 23 mmol/L (ref 22–32)
Calcium: 9.3 mg/dL (ref 8.9–10.3)
Chloride: 100 mmol/L (ref 98–111)
Creatinine, Ser: 1.1 mg/dL (ref 0.61–1.24)
GFR, Estimated: >60 mL/min (ref >60)
Glucose, Bld: 92 mg/dL (ref 70–99)
Potassium: 3.9 mmol/L (ref 3.5–5.1)
Sodium: 136 mmol/L (ref 135–145)

## 2020-11-18 LAB — SARS CORONAVIRUS 2 BY RT PCR (HOSPITAL ORDER, PERFORMED IN ~~LOC~~ HOSPITAL LAB): SARS Coronavirus 2: NEGATIVE

## 2020-11-18 MED ORDER — ACETAMINOPHEN 325 MG PO TABS
650.0000 mg | ORAL_TABLET | Freq: Once | ORAL | Status: AC
  Administered 2020-11-18: 650 mg via ORAL
  Filled 2020-11-18: qty 2

## 2020-11-18 NOTE — ED Triage Notes (Signed)
Pt BIB [facility] for weakness, prior to EMS arrival pt able to wambulate with no assistance, pt now requires 1-2 person assist. Recently dc oral potassium. Pt also reports decrease urinary output and urinary incontinent.  Pt states it feels like heavy metal laying on top of him  Stroke screen negative   BP 118/70 HR 90-120 a-fib hx of the same  94% RA  CBG 124 RR 22 98.4   18G LAC

## 2020-11-18 NOTE — ED Triage Notes (Signed)
Pt states he was freezing earlier today, under two blankets, and a headache. Pt reports he hasn't had a headache since starting his CPAP

## 2020-11-19 LAB — RESPIRATORY PANEL BY PCR

## 2020-11-19 LAB — CBG MONITORING, ED: Glucose-Capillary: 70 mg/dL (ref 70–99)

## 2020-11-19 LAB — LACTIC ACID, PLASMA: Lactic Acid, Venous: 1.2 mmol/L (ref 0.5–1.9)

## 2020-11-19 MED ORDER — SODIUM CHLORIDE 0.9 % IV SOLN
1.0000 g | Freq: Once | INTRAVENOUS | Status: AC
  Administered 2020-11-19: 1 g via INTRAVENOUS
  Filled 2020-11-19: qty 10

## 2020-11-19 MED ORDER — CEFDINIR 300 MG PO CAPS: 300.0000 mg | ORAL_CAPSULE | Freq: Two times a day (BID) | ORAL | 0 refills | Status: AC

## 2020-11-19 MED ORDER — ACETAMINOPHEN 325 MG PO TABS
650.0000 mg | ORAL_TABLET | Freq: Once | ORAL | Status: AC
  Administered 2020-11-19: 650 mg via ORAL
  Filled 2020-11-19: qty 2

## 2020-11-19 MED ORDER — SODIUM CHLORIDE 0.9 % IV BOLUS
500.0000 mL | Freq: Once | INTRAVENOUS | Status: AC
  Administered 2020-11-19: 500 mL via INTRAVENOUS

## 2020-11-19 NOTE — ED Notes (Signed)
Pt saturations do drop while sleeping, says that he has been wearing CPAP at night to sleep for 6 years.

## 2020-11-19 NOTE — ED Notes (Signed)
Pt ambulated in hall to restroom and back. Pt did report some SOB, on return to room, saturations 96% HR 117, improved to hr 96, 97% ra after rest.

## 2020-11-19 NOTE — ED Provider Notes (Signed)
[facility] EMERGENCY DEPARTMENT Provider Note   CSN: [id number] Arrival date & time: 11/18/20  1847     History Chief Complaint  Patient presents with  . Weakness    Ronald Shepherd is a 75 y.o. male.  HPI    Patient presents to the emergency department for evaluation of generalized weakness.  Patient reports that he felt fine for most of the day.  Performed all of his normal activities without difficulty.  This evening, however, he sat down in a chair and suddenly felt very cold.  Reports that he had shaking chills for a while and then when he tried to get out of the chair his arms were too weak to allow him to push himself up.  When he finally got up on his feet, he could not stand because of the weakness.  Patient denies any URI symptoms.  He has not had any abdominal pain, nausea, vomiting, diarrhea.  Past Medical History:  Diagnosis Date  . COPD (chronic obstructive pulmonary disease) (HCC)   . Hypertension     Patient Active Problem List   Diagnosis Date Noted  . Closed right hip fracture (HCC) 03/15/2020  . Essential hypertension 03/15/2020  . Permanent atrial fibrillation (HCC) 03/15/2020  . Fall at home, initial encounter 03/15/2020  . Former smoker 01/01/2019  . Idiopathic peripheral neuropathy 07/23/2018  . Prediabetes 01/22/2018  . History of colon polyps 09/23/2017  . Chronic obstructive pulmonary disease, unspecified (HCC) 03/28/2016  . Pulmonary hypertension (HCC) 03/28/2016  . Obstructive sleep apnea (adult) (pediatric) 07/11/2015  . Colonoscopy refused 06/28/2015  . Diastolic congestive heart failure (HCC) 05/23/2014    Past Surgical History:  Procedure Laterality Date  . 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 No pertinent family history.  Social History    Tobacco Use  . Smoking status: Former Smoker    Packs/day: 1.00    Years: 20.00    Pack years: 20.00    Types: Cigarettes    Quit date: 02/14/2015    Years since quitting: 5.7  . Smokeless tobacco: Never Used  Vaping Use  . Vaping Use: Never used  Substance Use Topics  . Alcohol use: Not Currently  . Drug use: Never    Home Medications Prior to Admission medications   Medication Sig Start Date End Date Taking? Authorizing Provider  albuterol (VENTOLIN HFA) 108 (90 Base) MCG/ACT inhaler Inhale 2 puffs into the lungs every 4 (four) hours as needed for shortness of breath. 04/05/20  Yes Lassen, Arlo C, PA-C  ascorbic acid (VITAMIN C) 500 MG tablet Take 500 mg by mouth daily.   Yes [provider]  atenolol (TENORMIN) 25 MG tablet Take 1 tablet (25 mg total) by mouth daily. 04/05/20  Yes Lassen, Arlo C, PA-C  b complex vitamins capsule Take 1 capsule by mouth at bedtime.   Yes [provider]  cefdinir (OMNICEF) 300 MG capsule Take 1 capsule (300 mg total) by mouth 2 (two) times daily. 11/19/20  Yes Pricsilla Lindvall, Canary Brim, MD  Cholecalciferol 25 MCG (1000 UT) capsule Take 1,000 Units by mouth daily. 05/02/15  Yes [provider]  ELIQUIS 5 MG TABS tablet Take 1 tablet (5 mg total) by mouth 2 (two) times daily. 04/05/20  Yes Lassen, Arlo C, PA-C  escitalopram (LEXAPRO) 10 MG tablet Take 1 tablet (  10 mg total) by mouth daily. 04/05/20  Yes Lassen, Arlo C, PA-C  Fluticasone-Salmeterol (ADVAIR) 250-50 MCG/DOSE AEPB Inhale 1 puff into the lungs 2 (two) times daily. 04/05/20  Yes Lassen, Arlo C, PA-C  furosemide (LASIX) 40 MG tablet Take 1 tablet (40 mg total) by mouth daily. 04/05/20  Yes Lassen, Arlo C, PA-C  methocarbamol (ROBAXIN) 500 MG tablet Take 1 tablet (500 mg total) by mouth every 12 (twelve) hours as needed for muscle spasms. 04/05/20  Yes Lassen, Arlo C, PA-C  Multiple Vitamins-Minerals (CENTRUM PO) Take 1 tablet by mouth at bedtime.   Yes [provider]   phenol (CHLORASEPTIC) 1.4 % LIQD Use as directed 2 sprays in the mouth or throat 4 (four) times daily as needed for throat irritation / pain.   Yes [provider]  rosuvastatin (CRESTOR) 10 MG tablet Take 1 tablet (10 mg total) by mouth at bedtime. Patient taking differently: Take 10 mg by mouth daily. 04/05/20  Yes Trudie ReedLassen, Arlo C, PA-C  spironolactone (ALDACTONE) 25 MG tablet Take 0.5 tablets (12.5 mg total) by mouth daily. 04/05/20  Yes Edmon CrapeLassen, Arlo C, PA-C    Allergies    Patient has no known allergies.  Review of Systems   Review of Systems  Physical Exam Updated Vital Signs BP 112/77   Pulse 100   Temp 99.4 F (37.4 C)   Resp 18   SpO2 92%   Physical Exam Vitals and nursing note reviewed.  Constitutional:      General: He is not in acute distress.    Appearance: Normal appearance. He is well-developed and well-nourished.  HENT:     Head: Normocephalic and atraumatic.     Right Ear: Hearing normal.     Left Ear: Hearing normal.     Nose: Nose normal.     Mouth/Throat:     Mouth: Oropharynx is clear and moist and mucous membranes are normal.  Eyes:     Extraocular Movements: EOM normal.     Conjunctiva/sclera: Conjunctivae normal.     Pupils: Pupils are equal, round, and reactive to light.  Cardiovascular:     Rate and Rhythm: Regular rhythm. Tachycardia present.     Heart sounds: S1 normal and S2 normal. No murmur heard. No friction rub. No gallop.   Pulmonary:     Effort: Pulmonary effort is normal. No respiratory distress.     Breath sounds: Normal breath sounds.  Chest:     Chest wall: No tenderness.  Abdominal:     General: Bowel sounds are normal.     Palpations: Abdomen is soft. There is no hepatosplenomegaly.     Tenderness: There is no abdominal tenderness. There is no guarding or rebound. Negative signs include Murphy's sign and McBurney's sign.     Hernia: No hernia is present.  Musculoskeletal:        General: Normal range of motion.      Cervical back: Normal range of motion and neck supple.     Right lower leg: Edema present.     Left lower leg: Edema present.  Skin:    General: Skin is warm, dry and intact.     Findings: Rash (Stasis dermatitis) present.     Nails: There is no cyanosis.  Neurological:     Mental Status: He is alert and oriented to person, place, and time.     GCS: GCS eye subscore is 4. GCS verbal subscore is 5. GCS motor subscore is 6.     Cranial  Nerves: No cranial nerve deficit.     Sensory: No sensory deficit.     Coordination: Coordination normal.     Deep Tendon Reflexes: Strength normal.  Psychiatric:        Mood and Affect: Mood and affect normal.        Speech: Speech normal.        Behavior: Behavior normal.        Thought Content: Thought content normal.     ED Results / Procedures / Treatments   Labs (all labs ordered are listed, but only abnormal results are displayed) Labs Reviewed  CBC - Abnormal; Notable for the following components:      Result Value   WBC 17.3 (*)    All other components within normal limits  URINALYSIS, ROUTINE W REFLEX MICROSCOPIC - Abnormal; Notable for the following components:   Ketones, ur 20 (*)    Leukocytes,Ua TRACE (*)    All other components within normal limits  SARS CORONAVIRUS 2 BY RT PCR (HOSPITAL ORDER, PERFORMED IN [facility])  RESPIRATORY PANEL BY PCR  CULTURE, BLOOD (ROUTINE X 2)  CULTURE, BLOOD (ROUTINE X 2)  URINE CULTURE  BASIC METABOLIC PANEL  LACTIC ACID, PLASMA  CBG MONITORING, ED    EKG EKG Interpretation  Date/Time:  Sunday November 19 2020 00:17:54 EST Ventricular Rate:  96 PR Interval:    QRS Duration: 102 QT Interval:  377 QTC Calculation: 477 R Axis:   -90 Text Interpretation: Atrial fibrillation Ventricular premature complex Left axis deviation RSR' in V1 or V2, probably normal variant Borderline prolonged QT interval Confirmed by Gilda Crease [phone]) on 11/19/2020 12:21:38  AM   Radiology DG Chest Portable 1 View  Result Date: 11/18/2020 CLINICAL DATA:  75 year old male with weakness. EXAM: PORTABLE CHEST 1 VIEW COMPARISON:  Chest radiograph dated 03/15/2020. FINDINGS: Mild chronic interstitial coarsening. No focal consolidation, pleural effusion, pneumothorax. Stable cardiomegaly. Atherosclerotic calcification of the aorta. No acute osseous pathology. IMPRESSION: No active disease. Electronically Signed   By: Elgie Collard M.D.   On: 11/18/2020 23:43    Procedures Procedures (including critical care time)  Medications Ordered in ED Medications  cefTRIAXone (ROCEPHIN) 1 g in sodium chloride 0.9 % 100 mL IVPB (has no administration in time range)  acetaminophen (TYLENOL) tablet 650 mg (650 mg Oral Given 11/18/20 1857)  acetaminophen (TYLENOL) tablet 650 mg (650 mg Oral Given 11/19/20 0046)  sodium chloride 0.9 % bolus 500 mL (0 mLs Intravenous Stopped 11/19/20 0134)    ED Course  I have reviewed the triage vital signs and the nursing notes.  Pertinent labs & imaging results that were available during my care of the patient were reviewed by me and considered in my medical decision making (see chart for details).    MDM Rules/Calculators/A&P                          Patient presents to the emergency department for evaluation of generalized weakness.  Patient has been feeling well throughout the day and then suddenly had onset of chills and rigors with a fever at home.  He reports that he became so weak that he had difficulty standing up out of his chair.  Patient had some improvement after Tylenol.  Patient has had extensive work-up.  Source of the fever is unclear.  COVID is negative.  Chest x-ray does not show evidence of pneumonia.  Urinalysis is clear.  Abdominal exam is benign.  He does not have URI symptoms.  Blood and urine cultures are pending.  He did have a leukocytosis but no obvious bacterial infectious findings.  Lactic acid is normal.  Will  await cultures.  Patient has been ambulatory here in the department.  At this point vital signs are normal, he has been ambulatory without hypoxia.  He did have a slightly elevated heart rate with ambulation but he has a history of chronic atrial fibrillation and reports that this is typical for him.  He did not have any findings that would warrant admission at this time.  Will cover empirically with antibiotics, await cultures.  Given return precautions.  Final Clinical Impression(s) / ED Diagnoses Final diagnoses:  Febrile illness, acute    Rx / DC Orders ED Discharge Orders         Ordered    cefdinir (OMNICEF) 300 MG capsule  2 times daily        11/19/20 0427           Gilda Crease, MD 11/19/20 [phone]

## 2020-11-20 LAB — URINE CULTURE

## 2020-11-24 LAB — CULTURE, BLOOD (ROUTINE X 2)
Culture: NO GROWTH
Culture: NO GROWTH
Special Requests: ADEQUATE
Special Requests: ADEQUATE

## 2021-03-22 IMAGING — RF DG HIP (WITH PELVIS) OPERATIVE*R*
1 series · 6 of 6 positions shown · non-contrast
Comparison: 03/15/2020

CLINICAL DATA: Elective surgery right hip.

EXAM:
OPERATIVE RIGHT HIP (WITH PELVIS IF PERFORMED) 5 VIEWS
TECHNIQUE: Fluoroscopic spot image(s) were submitted for interpretation
post-operatively.

[Series 1: unknown protocol · 6 of 6 slices shown]
[im 1/6]
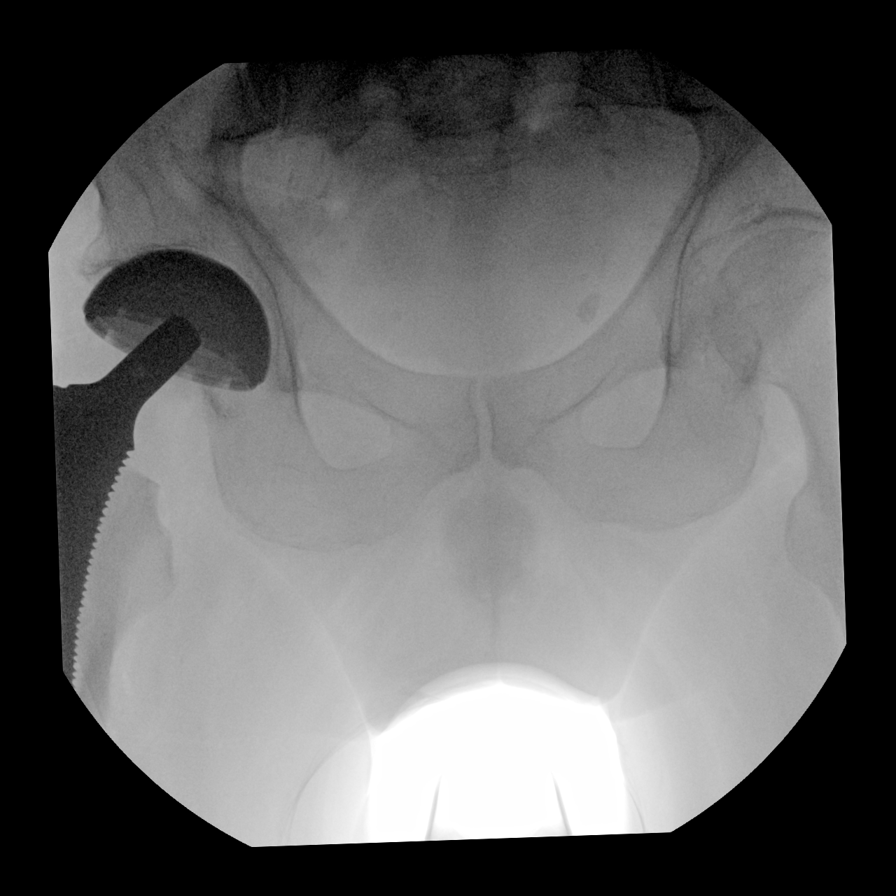
[im 2/6]
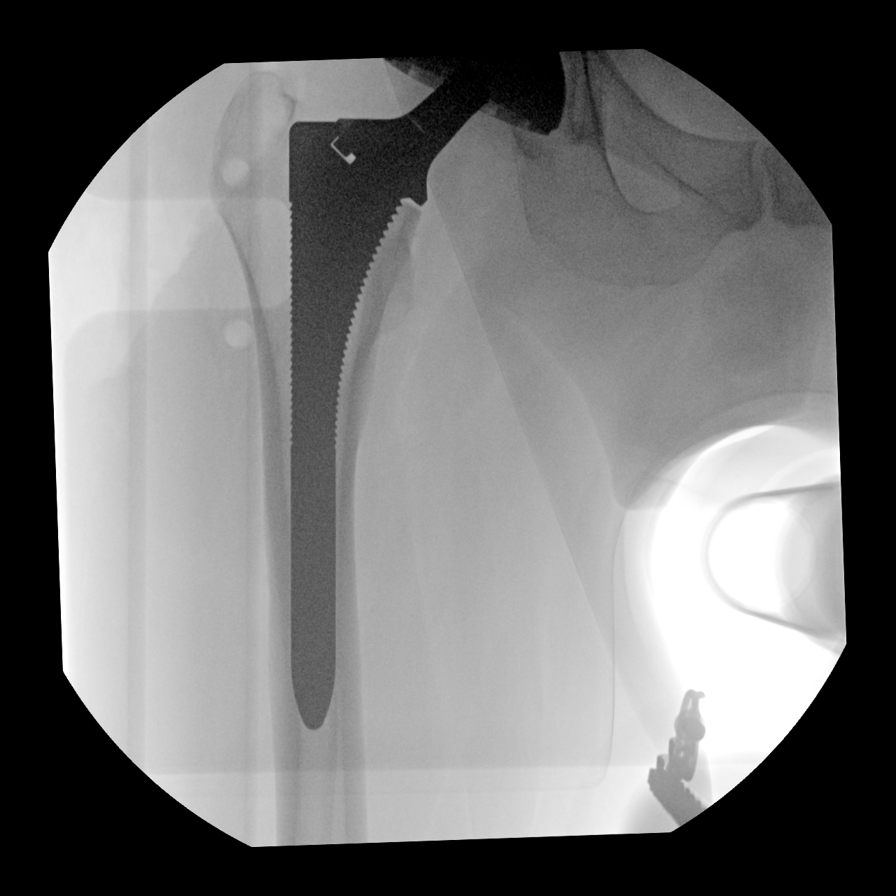
[im 3/6]
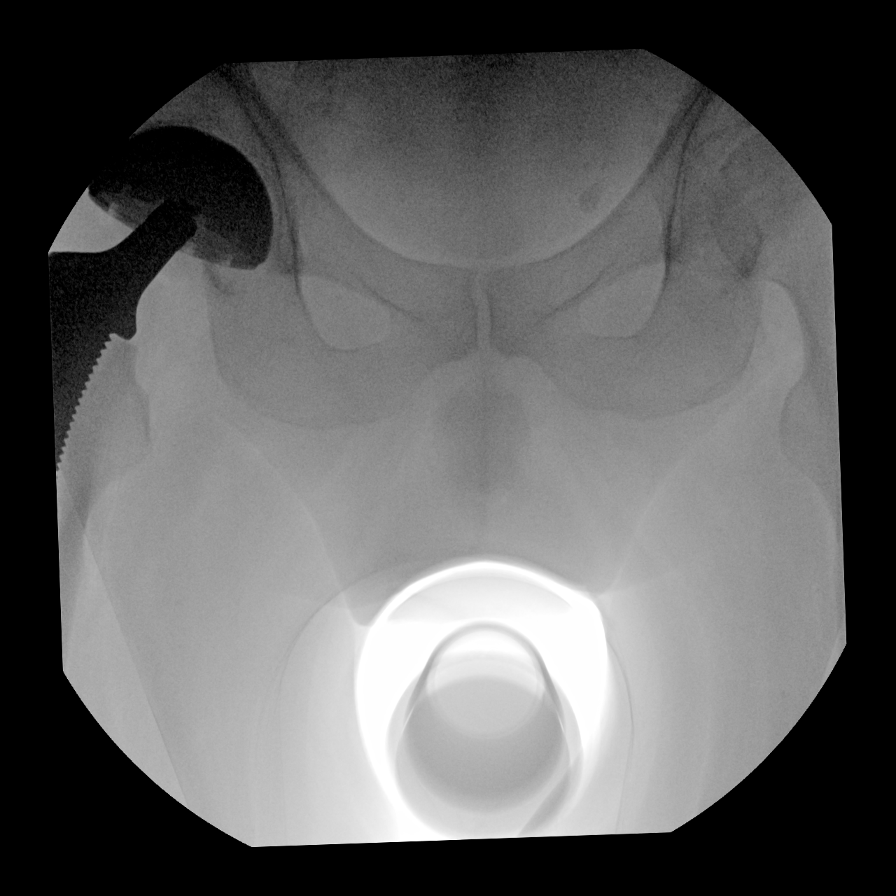
[im 4/6]
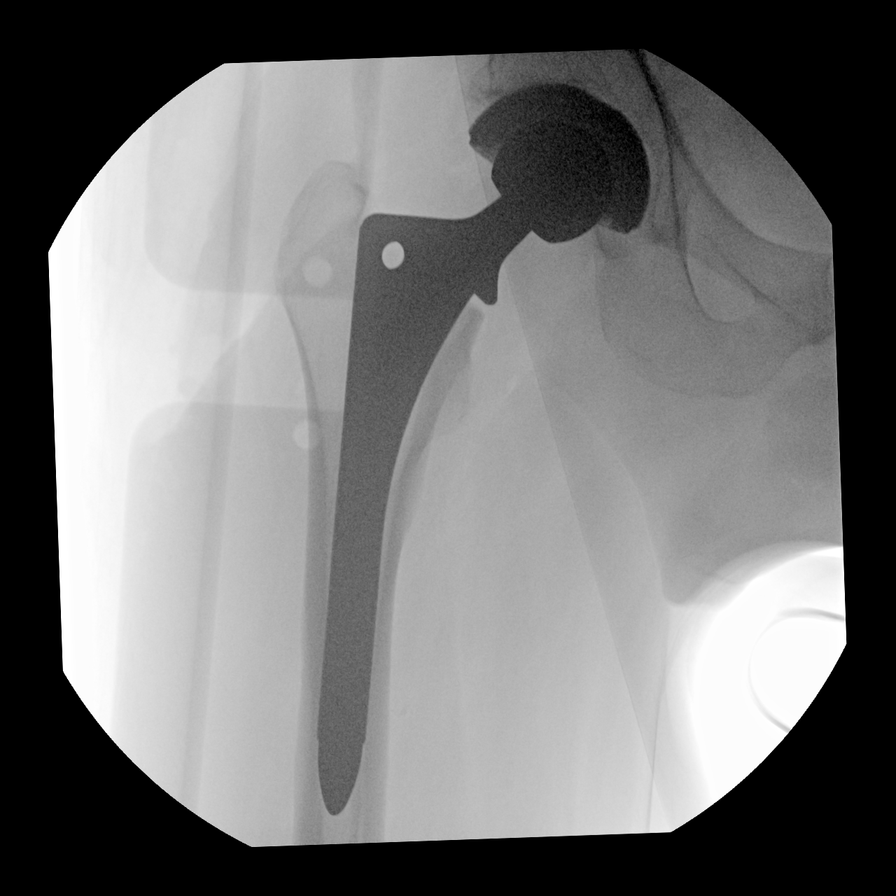
[im 5/6]
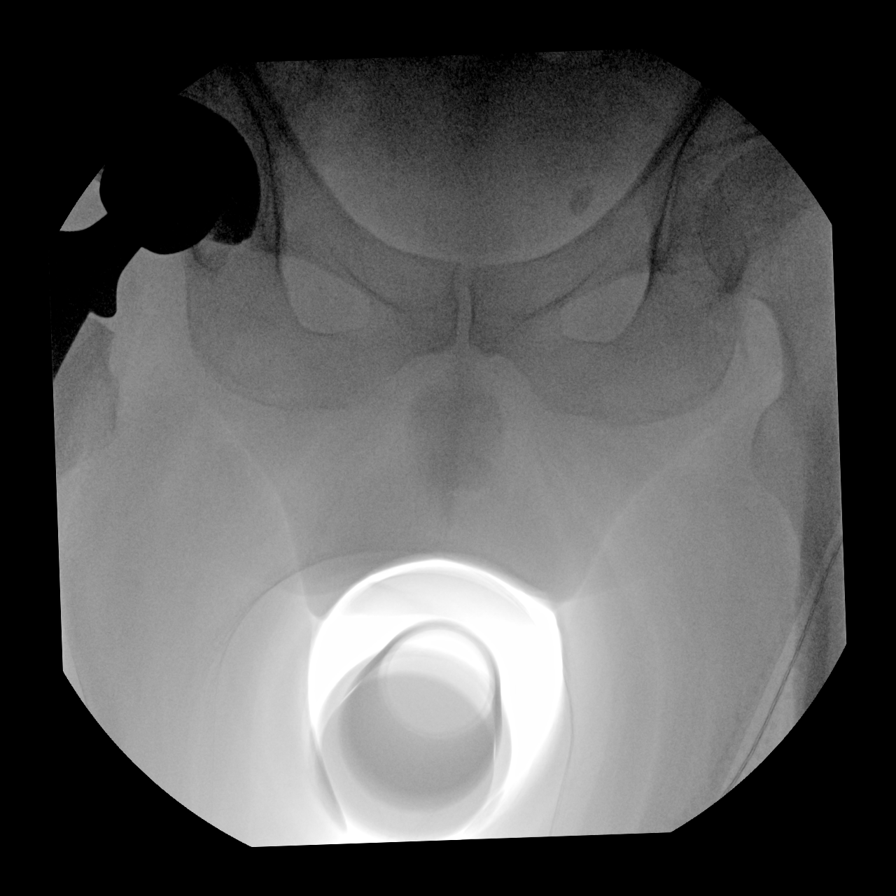
[im 6/6]
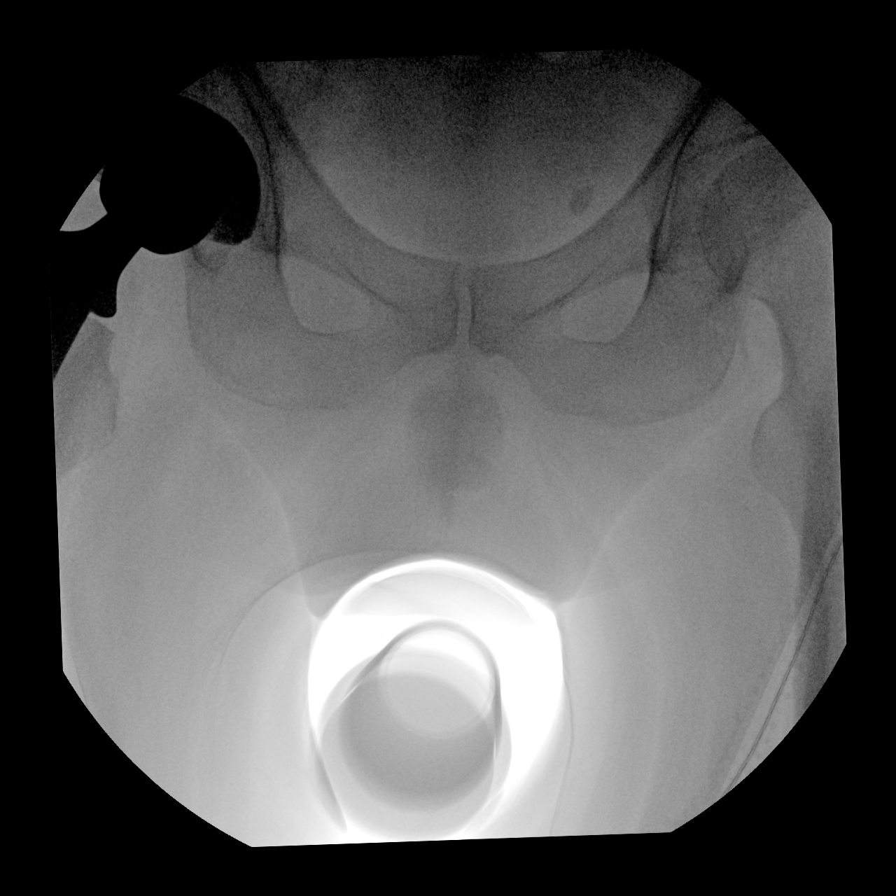

[6 of 6 positions shown; findings below may reference images not displayed]

FINDINGS: Several intraoperative fluoroscopic images over the right hip
demonstrate placement of a right hip arthroplasty which is intact
and normally located. Recommend correlation with findings at the
time of the procedure.
IMPRESSION: Expected changes post right hip arthroplasty.

## 2021-11-24 IMAGING — DX DG CHEST 1V PORT
1 series · 2 of 2 positions shown · non-contrast
Comparison: Chest radiograph dated 03/15/2020.

CLINICAL DATA: 74-year-old male with weakness.

EXAM:
PORTABLE CHEST 1 VIEW

[Series 1: chest · 0.14mm/px · 2 of 2 slices shown]
[im 1/2]
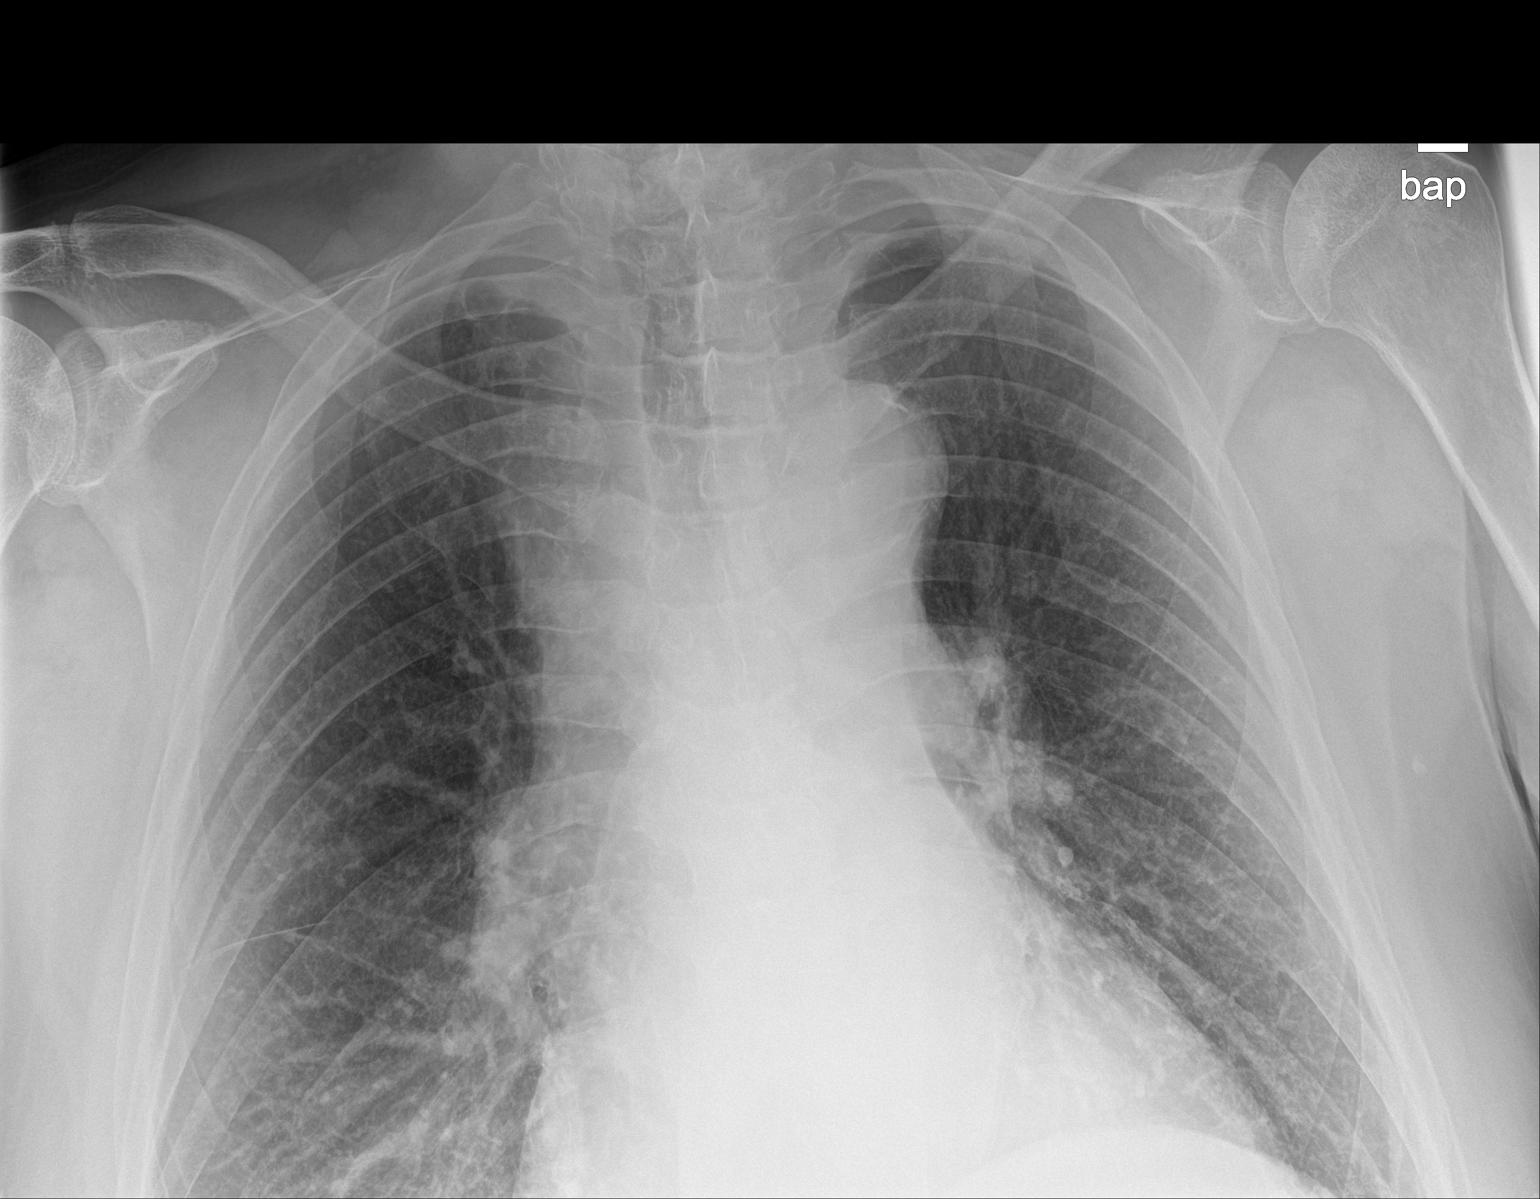
[im 2/2]
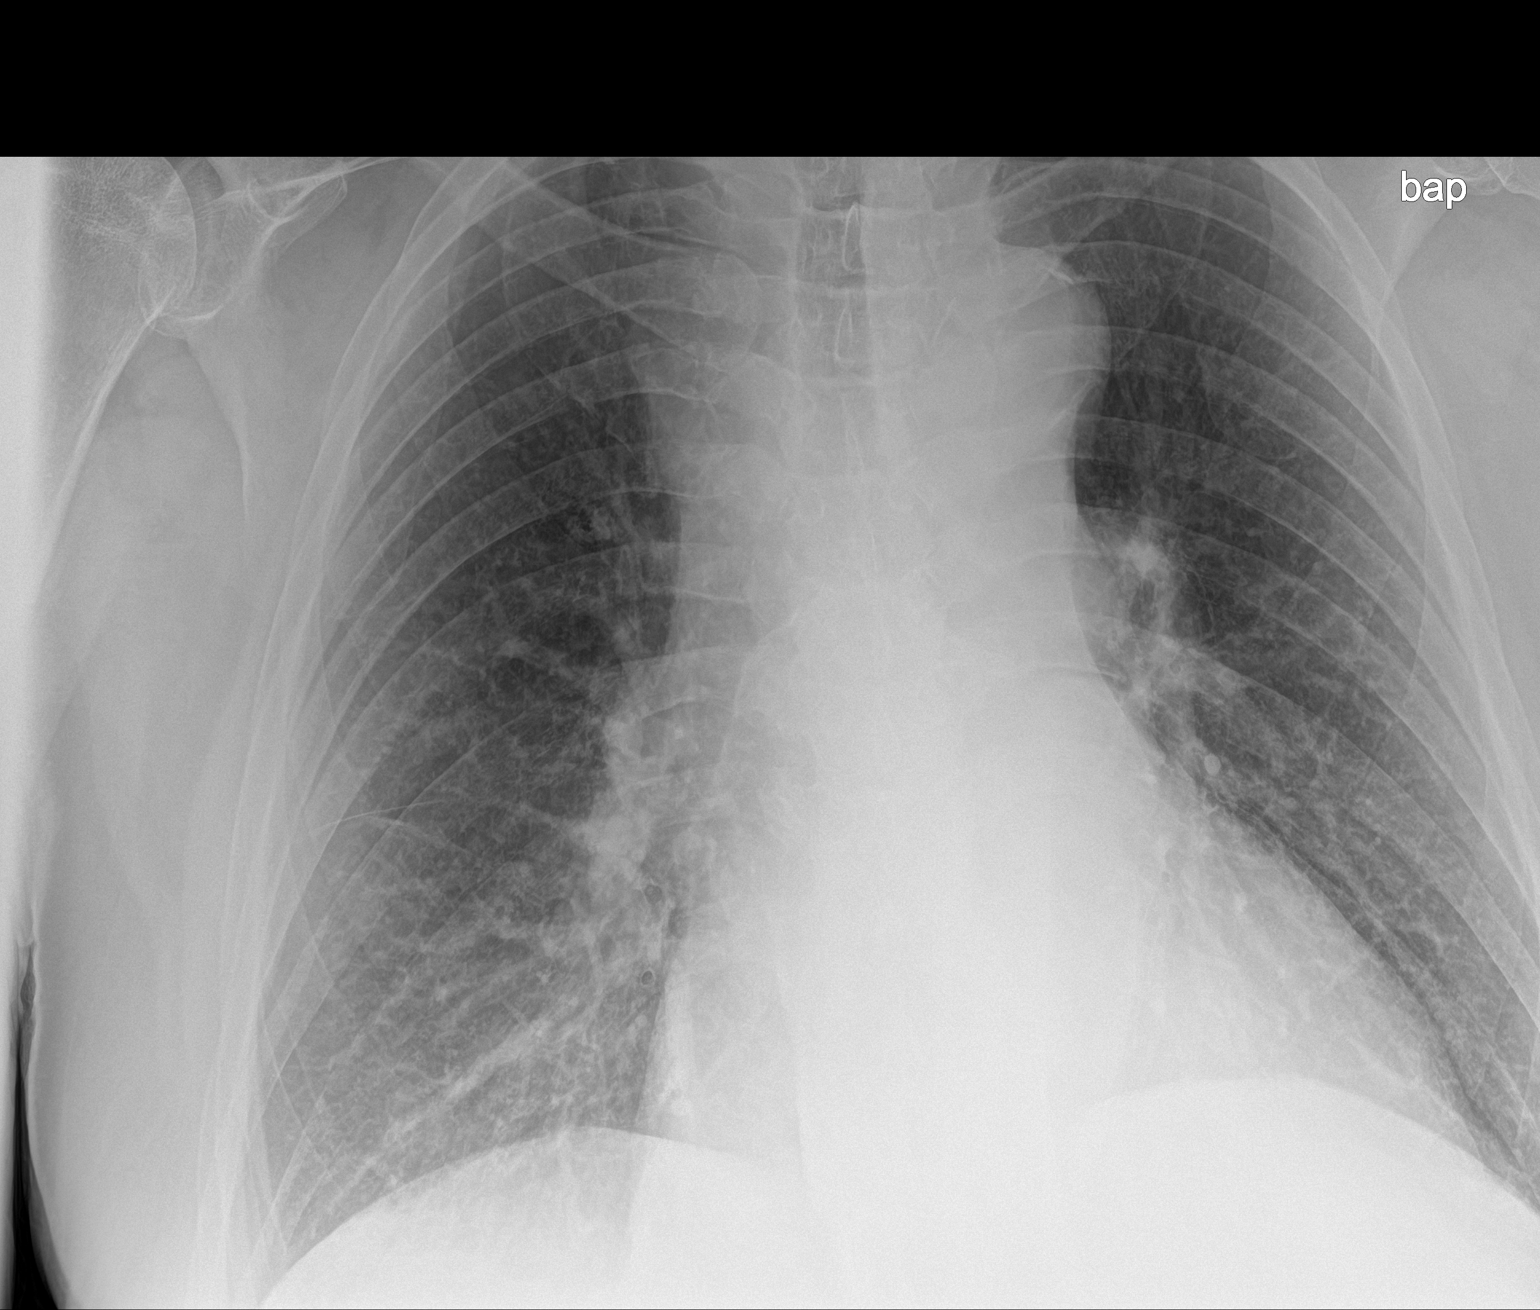

[2 of 2 positions shown; findings below may reference images not displayed]

FINDINGS: Mild chronic interstitial coarsening. No focal consolidation,
pleural effusion, pneumothorax. Stable cardiomegaly. Atherosclerotic
calcification of the aorta. No acute osseous pathology.
IMPRESSION: No active disease.
